# Patient Record
Sex: Male | Born: 1950 | Race: White | Hispanic: No | Marital: Married | Smoking: Never smoker
Health system: Southern US, Community
[De-identification: ages and names within clinical notes are randomized; demographics above are authoritative.]

## PROBLEM LIST (undated history)

## (undated) DIAGNOSIS — M109 Gout, unspecified: Secondary | ICD-10-CM

## (undated) DIAGNOSIS — I10 Essential (primary) hypertension: Secondary | ICD-10-CM

## (undated) DIAGNOSIS — M1990 Unspecified osteoarthritis, unspecified site: Secondary | ICD-10-CM

## (undated) DIAGNOSIS — N529 Male erectile dysfunction, unspecified: Secondary | ICD-10-CM

## (undated) DIAGNOSIS — H9193 Unspecified hearing loss, bilateral: Secondary | ICD-10-CM

## (undated) DIAGNOSIS — C61 Malignant neoplasm of prostate: Secondary | ICD-10-CM

## (undated) DIAGNOSIS — R351 Nocturia: Secondary | ICD-10-CM

## (undated) HISTORY — DX: Malignant neoplasm of prostate: C61

## (undated) HISTORY — PX: KNEE SURGERY: SHX244

## (undated) HISTORY — PX: CHOLECYSTECTOMY: SHX55

## (undated) HISTORY — DX: Unspecified hearing loss, bilateral: H91.93

## (undated) HISTORY — PX: OTHER SURGICAL HISTORY: SHX169

## (undated) HISTORY — PX: TONSILECTOMY, ADENOIDECTOMY, BILATERAL MYRINGOTOMY AND TUBES: SHX2538

## (undated) HISTORY — DX: Unspecified osteoarthritis, unspecified site: M19.90

## (undated) HISTORY — DX: Nocturia: R35.1

## (undated) HISTORY — DX: Essential (primary) hypertension: I10

## (undated) HISTORY — DX: Gout, unspecified: M10.9

## (undated) HISTORY — DX: Male erectile dysfunction, unspecified: N52.9

---

## 2020-02-06 ENCOUNTER — Ambulatory Visit: Payer: Self-pay | Admitting: Urology

## 2020-06-17 ENCOUNTER — Other Ambulatory Visit: Payer: Self-pay

## 2020-06-17 ENCOUNTER — Ambulatory Visit (INDEPENDENT_AMBULATORY_CARE_PROVIDER_SITE_OTHER): Payer: Medicare Other | Admitting: Podiatry

## 2020-06-17 ENCOUNTER — Ambulatory Visit (INDEPENDENT_AMBULATORY_CARE_PROVIDER_SITE_OTHER): Payer: Medicare Other

## 2020-06-17 DIAGNOSIS — M109 Gout, unspecified: Secondary | ICD-10-CM | POA: Diagnosis not present

## 2020-06-17 DIAGNOSIS — M7752 Other enthesopathy of left foot: Secondary | ICD-10-CM | POA: Diagnosis not present

## 2020-06-17 DIAGNOSIS — B351 Tinea unguium: Secondary | ICD-10-CM | POA: Diagnosis not present

## 2020-06-17 NOTE — Progress Notes (Signed)
   HPI: 70 y.o. male presenting today as a new patient for evaluation of 2 separate complaints.  First, the patient states that he has toenail fungus to the right hallux nail plate.  He has tried very expensive prescription Jublia topical with no improvement of the nail.  He states that it was a lot of money and there was no improvement of the nail despite daily applications. Next, the patient states that most recently he had an acute gout attack to his left great toe joint.  He states that he is always had a bump on the toe however when he changed some of his blood pressure medications it caused an acute gout flareup.  Currently the pain is 1/10.  He states over the past few weeks the pain has significantly improved.  No past medical history on file.   Physical Exam: General: The patient is alert and oriented x3 in no acute distress.  Dermatology: Skin is warm, dry and supple bilateral lower extremities. Negative for open lesions or macerations.  Hyperkeratotic dystrophic discolored nail noted to the right hallux nail plate consistent with findings of onychomycosis  Vascular: Palpable pedal pulses bilaterally.  There is some very mild edema and erythema localized around the first MTPJ of the left foot. Capillary refill within normal limits.  Neurological: Epicritic and protective threshold grossly intact bilaterally.   Musculoskeletal Exam: Range of motion within normal limits to all pedal and ankle joints bilateral. Muscle strength 5/5 in all groups bilateral.  There is some limited range of motion with degenerative changes noted to the first MTPJ of the left foot  Radiographic Exam:  Normal osseous mineralization.  Joint space narrowing with periarticular spurring noted to the first MTPJ left foot.  Assessment: 1.  First MTPJ capsulitis/hallux limitus left 2.  Acute gout first MTPJ left; mostly improved 3.  Onychomycosis of toenail right hallux nail plate   Plan of Care:  1. Patient  evaluated. X-Rays reviewed.  2.  Patient states that the right hallux nail plate does not bother him whatsoever.  Recommend simply observing the nail for now and keeping it trimmed and shortened.  The patient agrees 3.  In regards to the acute gout, the patient states that it has significantly improved over the last few weeks.  Continue conservative treatment.  Patient declined any steroidal injection or oral anti-inflammatory. 4.  Return to clinic as needed     Edrick Kins, DPM [facility]  Dr. Edrick Kins, DPM    [address], [place] [address]                Office [phone]  Fax [phone]

## 2020-07-24 DIAGNOSIS — Z8546 Personal history of malignant neoplasm of prostate: Secondary | ICD-10-CM | POA: Insufficient documentation

## 2020-07-24 DIAGNOSIS — M1A00X0 Idiopathic chronic gout, unspecified site, without tophus (tophi): Secondary | ICD-10-CM | POA: Diagnosis present

## 2020-07-24 DIAGNOSIS — I10 Essential (primary) hypertension: Secondary | ICD-10-CM | POA: Diagnosis present

## 2020-08-05 ENCOUNTER — Other Ambulatory Visit: Payer: Self-pay

## 2020-08-05 ENCOUNTER — Encounter: Payer: Self-pay | Admitting: Urology

## 2020-08-05 ENCOUNTER — Ambulatory Visit (INDEPENDENT_AMBULATORY_CARE_PROVIDER_SITE_OTHER): Payer: Medicare Other | Admitting: Urology

## 2020-08-05 VITALS — BP 189/91 | HR 77 | Ht 72.0 in | Wt 250.0 lb

## 2020-08-05 DIAGNOSIS — Z8546 Personal history of malignant neoplasm of prostate: Secondary | ICD-10-CM | POA: Diagnosis not present

## 2020-08-05 DIAGNOSIS — N400 Enlarged prostate without lower urinary tract symptoms: Secondary | ICD-10-CM | POA: Diagnosis not present

## 2020-08-05 DIAGNOSIS — N5203 Combined arterial insufficiency and corporo-venous occlusive erectile dysfunction: Secondary | ICD-10-CM

## 2020-08-05 MED ORDER — SILODOSIN 8 MG PO CAPS: 8.0000 mg | ORAL_CAPSULE | Freq: Every day | ORAL | 4 refills | Status: DC

## 2020-08-05 NOTE — Progress Notes (Signed)
08/05/2020 10:42 AM   Steve Sampson 21-Apr-1951 [id number]  Referring provider: [name], MD [facility] [place],  [place] [address]  Chief Complaint  Patient presents with  . Prostate Cancer    HPI: 70 year old male with a personal history of prostate cancer, erectile dysfunction nocturia presents today to establish care.  He was initially diagnosed with prostate cancer at age 79.  He underwent brachytherapy seed implantation.  This was in [place] at [facility] by Dr. Jilda Roche.  We do not have the records of the specifics in terms of risk category, etc.  His PSA has been low and stable, later appears to be around 0.1.  PSA was obtained by Dr. Doy Hutching on 07/24/2020 was 0.04.  He also has a personal history of BPH and was is chronically on Rapaflo.  With this medication, he has no urinary symptoms.  He denies any weak stream, incomplete bladder emptying, or any other symptoms on this drug.  He has been on it for many years.  He is requesting a refill.  He also has a personal issue of erectile dysfunction.  He previously used Cialis but is no longer using this medication.  He does not find it particularly effective.  He was also tried on sildenafil but this makes him dizzy.  At one while living in [place], they were considering doing a deep duplex penile Doppler but he never followed through with this.   PMH: Past Medical History:  Diagnosis Date  . Arthritis   . Bilateral hearing loss   . ED (erectile dysfunction)   . Gout   . Hypertension   . Nocturia   . Prostate cancer [facility])     Surgical History: Past Surgical History:  Procedure Laterality Date  . CHOLECYSTECTOMY    . KNEE SURGERY Right   . seed implantation prostate    . TONSILECTOMY, ADENOIDECTOMY, BILATERAL MYRINGOTOMY AND TUBES      Home Medications:  Allergies as of 08/05/2020   Not on File     Medication List       Accurate as of August 05, 2020 10:42 AM. If you have any  questions, ask your nurse or doctor.        febuxostat 40 MG tablet Commonly known as: ULORIC febuxostat 40 mg tablet  TAKE 1 TABLET (40 MG TOTAL) BY MOUTH ONCE DAILY FOR 30 DAYS   hydrochlorothiazide 25 MG tablet Commonly known as: HYDRODIURIL hydrochlorothiazide 25 mg tablet  TAKE 1 TABLET BY MOUTH EVERY DAY   Ibuprofen 200 MG Caps ibuprofen 200 mg capsule   indomethacin 50 MG capsule Commonly known as: INDOCIN Take 50 mg by mouth 3 (three) times daily as needed.   lisinopril 30 MG tablet Commonly known as: ZESTRIL lisinopril 30 mg tablet  TAKE 1 TABLET BY MOUTH EVERY DAY   olmesartan 20 MG tablet Commonly known as: BENICAR Take 20 mg by mouth daily.   silodosin 8 MG Caps capsule Commonly known as: RAPAFLO Take by mouth.       Allergies: Not on File  Family History: Family History  Problem Relation Age of Onset  . Prostate cancer Paternal Grandfather   . Colon cancer Other   . Esophageal cancer Other     Social History:  has no history on file for tobacco use, alcohol use, and drug use.   Physical Exam: BP (!) 189/91   Pulse 77   Ht 6' (1.829 m)   Wt 250 lb (113.4 kg)  BMI 33.91 kg/m   Constitutional:  Alert and oriented, No acute distress. HEENT: [place] AT, moist mucus membranes.  Trachea midline, no masses. Cardiovascular: No clubbing, cyanosis, or edema. Respiratory: Normal respiratory effort, no increased work of breathing. Skin: No rashes, bruises or suspicious lesions. Neurologic: Grossly intact, no focal deficits, moving all 4 extremities. Psychiatric: Normal mood and affect.  Assessment & Plan:    1. History of prostate cancer Status post brachytherapy with no evidence of disease, PSA remains low and stable  We did sign a release for records from [facility] to see if we get his original pathology for documentation purposes  2. Benign prostatic hyperplasia without lower urinary tract symptoms Symptoms well controlled on Rapaflo, continue  this medication, refills sent x1 year  3. Combined arterial insufficiency and corporo-venous occlusive erectile dysfunction Lengthy discussion today about alternative options beyond PDE 5 inhibitors including IPP, malleable penile prosthesis, injections, VED amongst others.  He is not interested in pursuing any of these at this point in time.  F/u 1 year for IPSS/ PVR/ PSA  Steve Espy, MD  [facility] [address] [place], [place] [address] [phone]

## 2021-01-06 ENCOUNTER — Ambulatory Visit (INDEPENDENT_AMBULATORY_CARE_PROVIDER_SITE_OTHER): Payer: Medicare Other | Admitting: Podiatry

## 2021-01-06 ENCOUNTER — Other Ambulatory Visit: Payer: Self-pay

## 2021-01-06 DIAGNOSIS — B351 Tinea unguium: Secondary | ICD-10-CM

## 2021-01-06 NOTE — Progress Notes (Signed)
   HPI: 70 y.o. male presenting today for follow-up evaluation regarding a dystrophic thickened toenail to the right hallux nail plate.  Patient states that recently he has been having some pain and tenderness associated to the right hallux nail plate.  He gets routine pedicures however the last 1 caused some tenderness to the area.  He was concerned for ingrown toenail.  He says that his wife actually trim the nail and he did have some relief.  He presents to have it evaluated.  Past Medical History:  Diagnosis Date   Arthritis    Bilateral hearing loss    ED (erectile dysfunction)    Gout    Hypertension    Nocturia    Prostate cancer [facility])      Physical Exam: General: The patient is alert and oriented x3 in no acute distress.  Dermatology: Skin is warm, dry and supple bilateral lower extremities. Negative for open lesions or macerations.  Hyperkeratotic dystrophic discolored nail noted to the right hallux nail plate consistent with findings of onychomycosis  Vascular: Palpable pedal pulses bilaterally.  There is some very mild edema and erythema localized around the first MTPJ of the left foot. Capillary refill within normal limits.  Neurological: Epicritic and protective threshold grossly intact bilaterally.   Musculoskeletal Exam: No pedal deformity   Assessment: 1.  Onychomycosis of toenail right hallux nail plate   Plan of Care:  1. Patient evaluated.  2.  Mechanical debridement of the right hallux nail plate was performed using a nail nipper without incident or bleeding.  The offending nail borders of the nail were debrided away and the patient felt relief. 3.  Return to clinic as needed   Edrick Kins, DPM [facility]  Dr. Edrick Kins, DPM    [address], [place] [address]                Office [phone]  Fax [phone]

## 2021-02-25 ENCOUNTER — Ambulatory Visit (INDEPENDENT_AMBULATORY_CARE_PROVIDER_SITE_OTHER): Payer: Medicare Other | Admitting: Dermatology

## 2021-02-25 ENCOUNTER — Other Ambulatory Visit: Payer: Self-pay

## 2021-02-25 DIAGNOSIS — Z1283 Encounter for screening for malignant neoplasm of skin: Secondary | ICD-10-CM

## 2021-02-25 DIAGNOSIS — L820 Inflamed seborrheic keratosis: Secondary | ICD-10-CM

## 2021-02-25 DIAGNOSIS — D229 Melanocytic nevi, unspecified: Secondary | ICD-10-CM

## 2021-02-25 DIAGNOSIS — Z85828 Personal history of other malignant neoplasm of skin: Secondary | ICD-10-CM

## 2021-02-25 DIAGNOSIS — L578 Other skin changes due to chronic exposure to nonionizing radiation: Secondary | ICD-10-CM

## 2021-02-25 DIAGNOSIS — L219 Seborrheic dermatitis, unspecified: Secondary | ICD-10-CM

## 2021-02-25 DIAGNOSIS — L821 Other seborrheic keratosis: Secondary | ICD-10-CM

## 2021-02-25 DIAGNOSIS — D1800 Hemangioma unspecified site: Secondary | ICD-10-CM

## 2021-02-25 DIAGNOSIS — B353 Tinea pedis: Secondary | ICD-10-CM | POA: Diagnosis not present

## 2021-02-25 DIAGNOSIS — L918 Other hypertrophic disorders of the skin: Secondary | ICD-10-CM

## 2021-02-25 DIAGNOSIS — L814 Other melanin hyperpigmentation: Secondary | ICD-10-CM

## 2021-02-25 MED ORDER — KETOCONAZOLE 2 % EX CREA: TOPICAL_CREAM | CUTANEOUS | 3 refills | Status: DC

## 2021-02-25 MED ORDER — KETOCONAZOLE 2 % EX SHAM: MEDICATED_SHAMPOO | CUTANEOUS | 3 refills | Status: DC

## 2021-02-25 NOTE — Progress Notes (Signed)
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 Michel Bickers in [place] or [place] ). The patient presents for Total-Body Skin Exam (TBSE) for skin cancer screening and mole check.  The following portions of the chart were reviewed this encounter and updated as appropriate:   Allergies  Meds  Problems  Med Hx  Surg Hx  Fam Hx      Review of Systems:  No other skin or systemic complaints except as noted in HPI or Assessment and Plan.  Objective  Well appearing patient in no apparent distress; mood and affect are within normal limits.  A full examination was performed including scalp, head, eyes, ears, nose, lips, neck, chest, axillae, abdomen, back, buttocks, bilateral upper extremities, bilateral lower extremities, hands, feet, fingers, toes, fingernails, and toenails. All findings within normal limits unless otherwise noted below.  L lower eyelid, R inf scapula, L low back paraspinal (2) Erythematous keratotic or waxy stuck-on papule or plaque.    B/L foot Scaling and maceration web spaces and over distal and lateral soles.    Assessment & Plan  Inflamed seborrheic keratosis L lower eyelid; R inf scapula, L low back paraspinal (2)  Recheck at follow up appointment.  Destruction of lesion - R inf scapula, L low back paraspinal Complexity: simple   Destruction method: cryotherapy   Informed consent: discussed and consent obtained   Timeout:  patient name, date of birth, surgical site, and procedure verified Lesion destroyed using liquid nitrogen: Yes   Region frozen until ice ball extended beyond lesion: Yes   Outcome: patient tolerated procedure well with no complications   Post-procedure details: wound care instructions given    Seborrheic dermatitis Face and  scalp Seborrheic Dermatitis  -  is a chronic persistent rash characterized by pinkness and scaling most commonly of the mid face but also can occur on the scalp (dandruff), ears; mid chest, mid back and groin.  It tends to be exacerbated by stress and cooler weather.  People who have neurologic disease may experience new onset or exacerbation of existing seborrheic dermatitis.  The condition is not curable but treatable and can be controlled.  Start Ketoconazole 2% shampoo 3d/wk. Continue Ketoconazole 2% cream QHS. Consider adding HC cream if not improved at follow up appointment  ketoconazole (NIZORAL) 2 % shampoo - Face and scalp Shampoo from the neck up let sit a few minutes then wash off. Use 3d/wk. ketoconazole (NIZORAL) 2 % cream - Face and scalp Apply to the face and feet QHS.  Tinea pedis of both feet B/L foot Chronic and persistent. Use ketoconazole cream nightly to feet for at least 6 to 8 weeks.  Skin cancer screening  Lentigines - Scattered tan macules - Due to sun exposure - Benign-appearing, observe - Recommend daily broad spectrum sunscreen SPF 30+ to sun-exposed areas, reapply every 2 hours as needed. - Call for any changes  Seborrheic Keratoses - Stuck-on, waxy, tan-brown papules and/or plaques  - Benign-appearing - Discussed benign etiology and prognosis. - Observe - Call for any changes  Melanocytic Nevi - Tan-brown and/or pink-flesh-colored symmetric macules and papules - Benign appearing on exam today - Observation - Call clinic for new or changing moles - Recommend daily use of broad spectrum spf 30+ sunscreen to sun-exposed areas.  Hemangiomas - Red papules - Discussed benign nature - Observe - Call for any changes  Actinic Damage - Chronic condition, secondary to cumulative UV/sun exposure - diffuse scaly erythematous macules with underlying dyspigmentation - Recommend daily broad spectrum sunscreen SPF 30+ to sun-exposed areas, reapply every  2 hours as needed.  - Staying in the shade or wearing long sleeves, sun glasses (UVA+UVB protection) and wide brim hats (4-inch brim around the entire circumference of the hat) are also recommended for sun protection.  - Call for new or changing lesions.  Acrochordons (Skin Tags) - Fleshy, skin-colored pedunculated papules - Benign appearing.  - Observe. - If desired, they can be removed with an in office procedure that is not covered by insurance. - Please call the clinic if you notice any new or changing lesions.  Skin cancer screening performed today.  Return in about 1 year (around 02/25/2022) for TBSE.  [name], CMA, am acting as scribe for Sarina Ser, MD . Documentation: I have reviewed the above documentation for accuracy and completeness, and I agree with the above.  Sarina Ser, MD

## 2021-02-25 NOTE — Patient Instructions (Signed)

## 2021-03-03 ENCOUNTER — Encounter: Payer: Self-pay | Admitting: Dermatology

## 2021-03-17 ENCOUNTER — Ambulatory Visit: Payer: Medicare Other | Admitting: Dermatology

## 2021-03-19 ENCOUNTER — Ambulatory Visit: Payer: Medicare Other | Admitting: Urology

## 2021-03-25 ENCOUNTER — Encounter: Payer: Self-pay | Admitting: Urology

## 2021-03-25 ENCOUNTER — Other Ambulatory Visit: Payer: Self-pay

## 2021-03-25 ENCOUNTER — Ambulatory Visit (INDEPENDENT_AMBULATORY_CARE_PROVIDER_SITE_OTHER): Payer: Medicare Other | Admitting: Urology

## 2021-03-25 ENCOUNTER — Ambulatory Visit: Payer: Medicare Other | Admitting: Urology

## 2021-03-25 VITALS — BP 180/109 | HR 80 | Ht 72.0 in | Wt 252.0 lb

## 2021-03-25 DIAGNOSIS — N529 Male erectile dysfunction, unspecified: Secondary | ICD-10-CM

## 2021-03-25 DIAGNOSIS — Z8546 Personal history of malignant neoplasm of prostate: Secondary | ICD-10-CM | POA: Diagnosis not present

## 2021-03-25 DIAGNOSIS — R3121 Asymptomatic microscopic hematuria: Secondary | ICD-10-CM

## 2021-03-25 LAB — URINALYSIS, COMPLETE
Bilirubin, UA: NEGATIVE
Glucose, UA: NEGATIVE
Ketones, UA: NEGATIVE
Leukocytes,UA: NEGATIVE
Nitrite, UA: NEGATIVE
Protein,UA: NEGATIVE
RBC, UA: NEGATIVE
Specific Gravity, UA: 1.025 (ref 1.005–1.030)
Urobilinogen, Ur: 0.2 mg/dL (ref 0.2–1.0)
pH, UA: 5.5 (ref 5.0–7.5)

## 2021-03-25 LAB — MICROSCOPIC EXAMINATION
Bacteria, UA: NONE SEEN
Epithelial Cells (non renal): NONE SEEN /hpf (ref 0–10)
WBC, UA: NONE SEEN /hpf (ref 0–5)

## 2021-03-25 NOTE — Progress Notes (Signed)
   03/25/2021 1:56 PM   Steve Sampson [birth year] [id number]  Reason for visit: Follow up prostate cancer, ED, microscopic hematuria  HPI: 70 year old male who was diagnosed with a single core of low risk 3+3= 6 prostate cancer at age 82 and treated with brachytherapy at [facility] in [place].  PSA has been very low since that time, most recently 0.02 which is stable from 0.04 last year.  Aside from nocturia 1-2 times per night when he consumes soda or caffeine he really has no urinary symptoms.  He denies any gross hematuria.  He was recently found to have 10-50 RBCs on a urinalysis, but he reports this was after mowing the lawn and having some pelvic discomfort.  Urinalysis today is completely benign with 0 RBCs.  He reportedly had negative work-up previously for microscopic hematuria in the [facility] with cystoscopy and CT.  He also reportedly had a normal CT within the last 12 months at an outside hospital.  We discussed AUA guidelines that recommend considering cystoscopy and CT urogram for patients with microscopic hematuria, and he would like to defer further evaluation at this time, but he understands the need for further analysis if he has new gross hematuria, or significant microscopic hematuria on repeat urinalysis in the future.  He understands the low, but nonzero risk of missing a clinically significant malignancy by deferring further evaluation.  He does not have any smoking history, but previously worked with numerous occupational hazards.  Finally, he has had ED refractory to Viagra(severe dizziness) and Cialis(stopped being effective).  He is not interested in injections or penile prosthesis, but is interested in some of the new ultrasound ED technologies.  We reviewed the AUA guidelines that do not show good evidence behind these newer technologies, and that that is not offered here in [place].  Recommend repeat urinalysis and PSA in 1 year   I spent 30 total minutes on the day of the  encounter including pre-visit review of the medical record, face-to-face time with the patient, and post visit ordering of labs/imaging/tests.  Billey Co, [facility] [address], [place], [place] [address] [phone]

## 2021-06-01 ENCOUNTER — Ambulatory Visit: Payer: Medicare Other | Admitting: Dermatology

## 2021-06-16 ENCOUNTER — Observation Stay
Admission: EM | Admit: 2021-06-16 | Discharge: 2021-06-17 | Disposition: A | Discharge status: Home / Self Care | Payer: Medicare PPO | Attending: Internal Medicine | Admitting: Internal Medicine

## 2021-06-16 ENCOUNTER — Encounter: Payer: Self-pay | Admitting: Internal Medicine

## 2021-06-16 ENCOUNTER — Emergency Department: Payer: Medicare PPO

## 2021-06-16 ENCOUNTER — Other Ambulatory Visit: Payer: Self-pay

## 2021-06-16 ENCOUNTER — Inpatient Hospital Stay: Payer: Medicare PPO

## 2021-06-16 DIAGNOSIS — Z79899 Other long term (current) drug therapy: Secondary | ICD-10-CM | POA: Insufficient documentation

## 2021-06-16 DIAGNOSIS — I6782 Cerebral ischemia: Secondary | ICD-10-CM | POA: Insufficient documentation

## 2021-06-16 DIAGNOSIS — Z8546 Personal history of malignant neoplasm of prostate: Secondary | ICD-10-CM | POA: Insufficient documentation

## 2021-06-16 DIAGNOSIS — R4781 Slurred speech: Principal | ICD-10-CM | POA: Diagnosis present

## 2021-06-16 DIAGNOSIS — H539 Unspecified visual disturbance: Secondary | ICD-10-CM

## 2021-06-16 DIAGNOSIS — I10 Essential (primary) hypertension: Secondary | ICD-10-CM | POA: Diagnosis not present

## 2021-06-16 DIAGNOSIS — E1165 Type 2 diabetes mellitus with hyperglycemia: Secondary | ICD-10-CM | POA: Diagnosis not present

## 2021-06-16 DIAGNOSIS — H538 Other visual disturbances: Secondary | ICD-10-CM | POA: Diagnosis not present

## 2021-06-16 DIAGNOSIS — R531 Weakness: Secondary | ICD-10-CM | POA: Diagnosis not present

## 2021-06-16 DIAGNOSIS — I4510 Unspecified right bundle-branch block: Secondary | ICD-10-CM | POA: Insufficient documentation

## 2021-06-16 DIAGNOSIS — Z8616 Personal history of COVID-19: Secondary | ICD-10-CM | POA: Insufficient documentation

## 2021-06-16 DIAGNOSIS — R600 Localized edema: Secondary | ICD-10-CM | POA: Insufficient documentation

## 2021-06-16 DIAGNOSIS — Z20822 Contact with and (suspected) exposure to covid-19: Secondary | ICD-10-CM | POA: Insufficient documentation

## 2021-06-16 DIAGNOSIS — M1A00X0 Idiopathic chronic gout, unspecified site, without tophus (tophi): Secondary | ICD-10-CM | POA: Diagnosis present

## 2021-06-16 DIAGNOSIS — R4789 Other speech disturbances: Secondary | ICD-10-CM

## 2021-06-16 DIAGNOSIS — M1A9XX0 Chronic gout, unspecified, without tophus (tophi): Secondary | ICD-10-CM | POA: Diagnosis present

## 2021-06-16 DIAGNOSIS — I159 Secondary hypertension, unspecified: Secondary | ICD-10-CM

## 2021-06-16 LAB — BASIC METABOLIC PANEL
Anion gap: 10 (ref 5–15)
BUN: 21 mg/dL (ref 8–23)
CO2: 25 mmol/L (ref 22–32)
Calcium: 9.6 mg/dL (ref 8.9–10.3)
Chloride: 104 mmol/L (ref 98–111)
Creatinine, Ser: 1.06 mg/dL (ref 0.61–1.24)
GFR, Estimated: >60 mL/min (ref >60)
Glucose, Bld: 152 mg/dL — ABNORMAL HIGH (ref 70–99)
Potassium: 3.9 mmol/L (ref 3.5–5.1)
Sodium: 139 mmol/L (ref 135–145)

## 2021-06-16 LAB — CBC
HCT: 45.1 % (ref 39.0–52.0)
Hemoglobin: 14.9 g/dL (ref 13.0–17.0)
MCH: 27.4 pg (ref 26.0–34.0)
MCHC: 33 g/dL (ref 30.0–36.0)
MCV: 83.1 fL (ref 80.0–100.0)
Platelets: 189 10*3/uL (ref 150–400)
RBC: 5.43 MIL/uL (ref 4.22–5.81)
RDW: 13.1 % (ref 11.5–15.5)
WBC: 5.7 10*3/uL (ref 4.0–10.5)
nRBC: 0 % (ref 0.0–0.2)

## 2021-06-16 LAB — RESP PANEL BY RT-PCR (FLU A&B, COVID) ARPGX2
Influenza A by PCR: NEGATIVE
Influenza B by PCR: NEGATIVE
SARS Coronavirus 2 by RT PCR: NEGATIVE

## 2021-06-16 LAB — PROTIME-INR
INR: 1 (ref 0.8–1.2)
Prothrombin Time: 12.8 seconds (ref 11.4–15.2)

## 2021-06-16 LAB — TROPONIN I (HIGH SENSITIVITY): Troponin I (High Sensitivity): 13 ng/L (ref <18)

## 2021-06-16 LAB — T4, FREE: Free T4: 0.77 ng/dL (ref 0.61–1.12)

## 2021-06-16 IMAGING — CT CT HEAD W/O CM
4 series · 16 of 47 positions shown, 18 images · non-contrast
Comparison: None.

CLINICAL DATA: Dizziness, nonspecific. Blurred vision and
hypertension. Slurred speech.



[Series 2: head wo · axial · 0.43mm/px · z∈[+551,+671]mm · 7 of 33 slices shown, 9 images]
[im 5/33  brain]
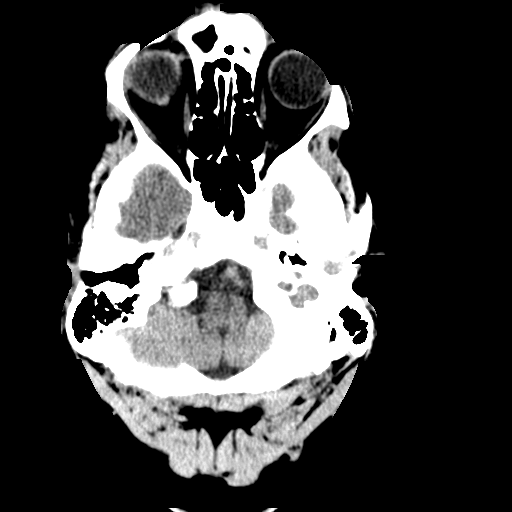
[im 5/33  bone]
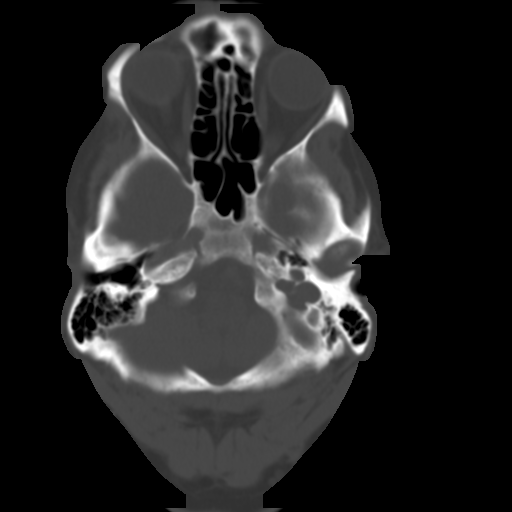
[im 9/33  brain]
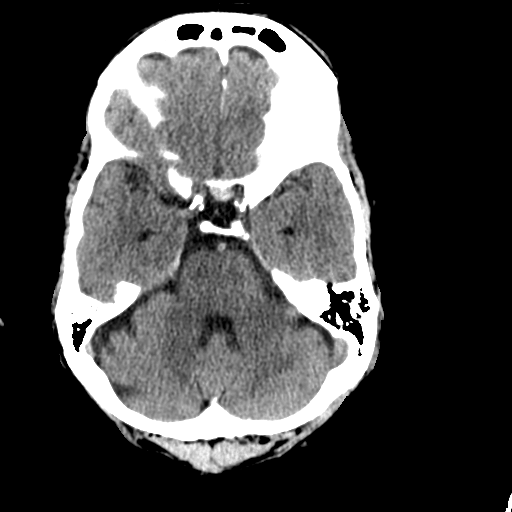
[im 13/33  brain]
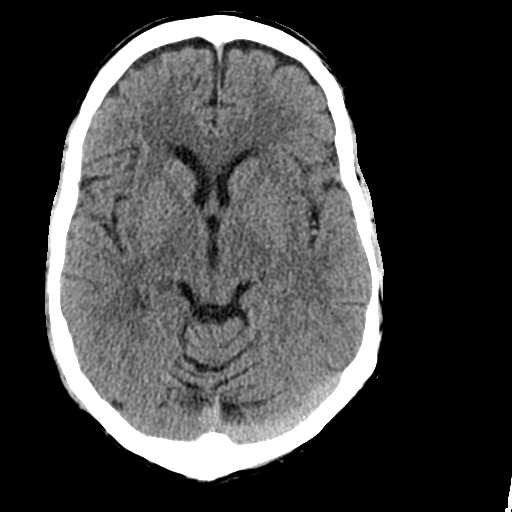
[im 17/33  brain]
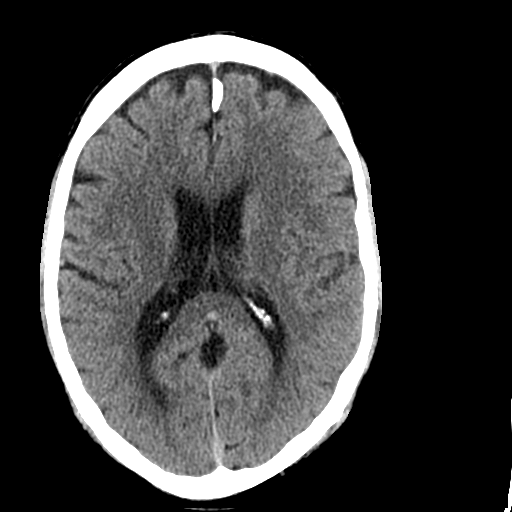
[im 21/33  brain]
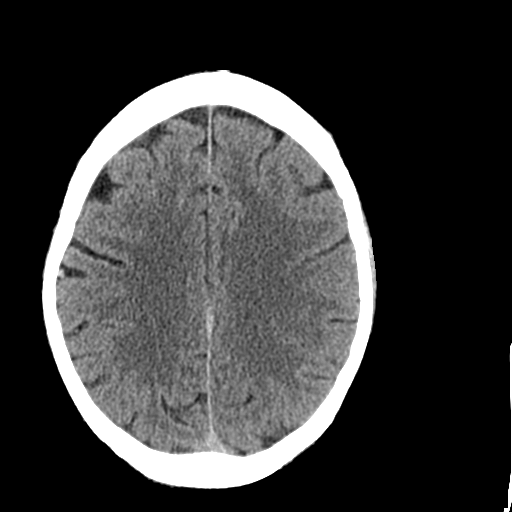
[im 21/33  bone]
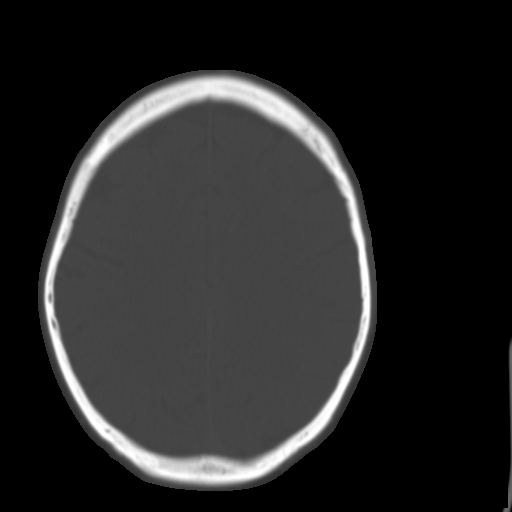
[im 25/33  brain]
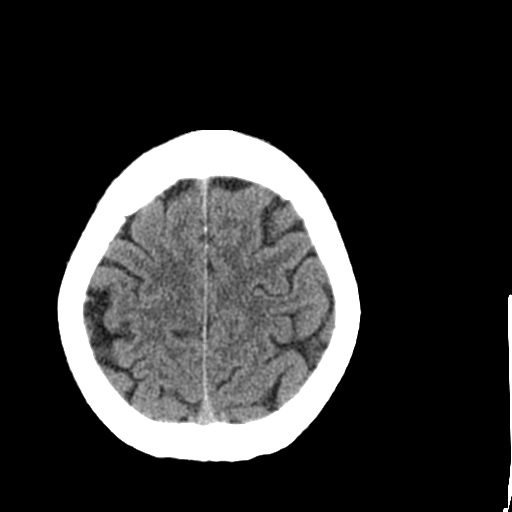
[im 29/33  brain]
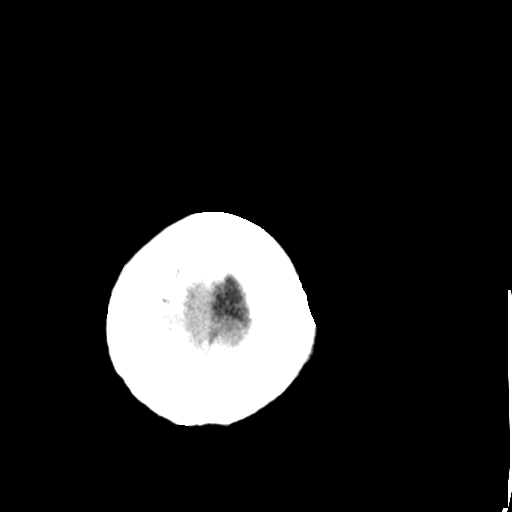

[Series 3: coronal soft tissue · coronal · 0.31mm/px · 3 of 72 slices shown]
[im 24/72  brain]
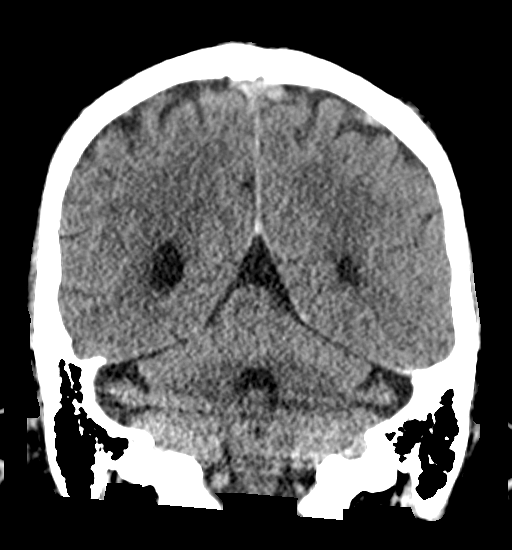
[im 32/72  brain]
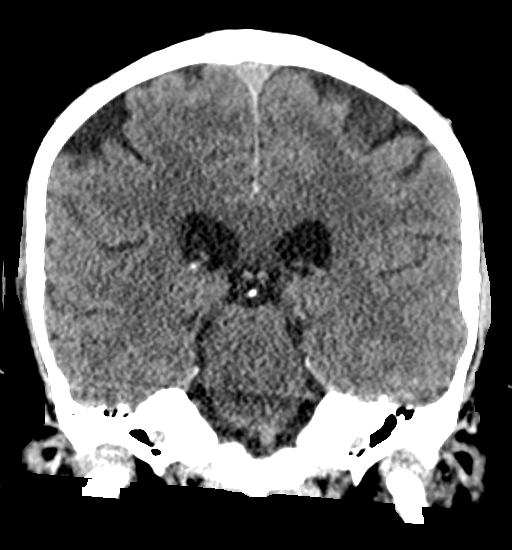
[im 40/72  brain]
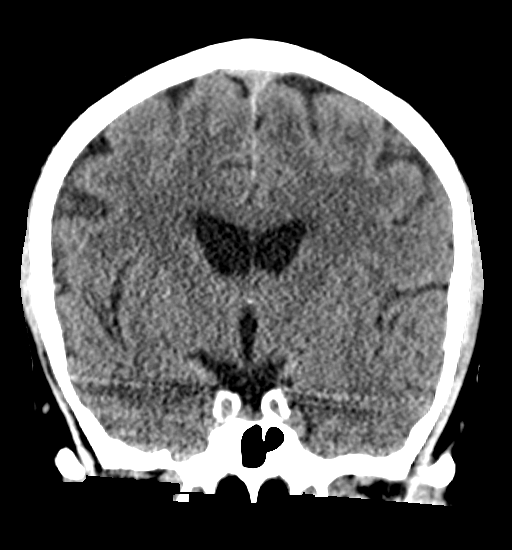

[Series 4: head bone · axial · 0.43mm/px · z∈[+547,+579]mm · 3 of 81 slices shown]
[im 9/81  bone]
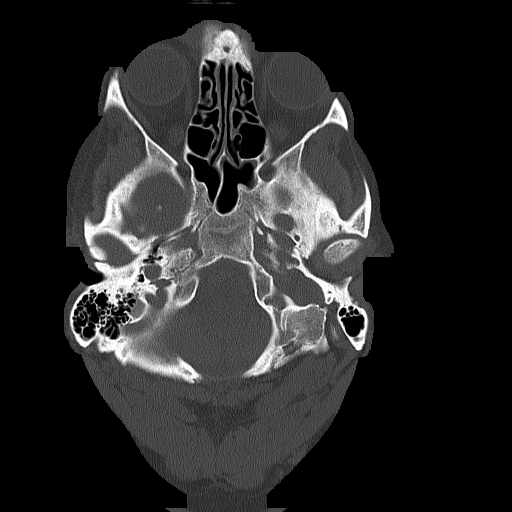
[im 17/81  bone]
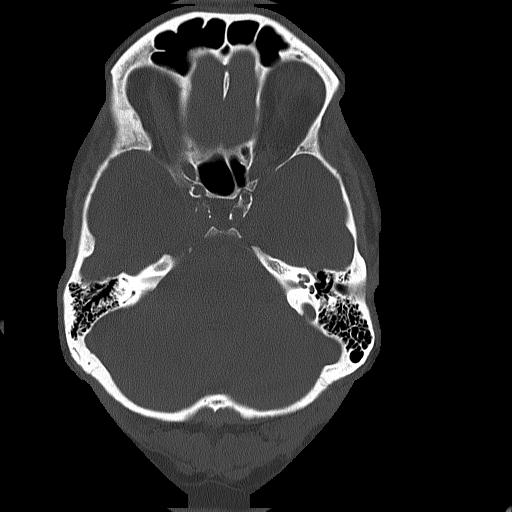
[im 25/81  bone]
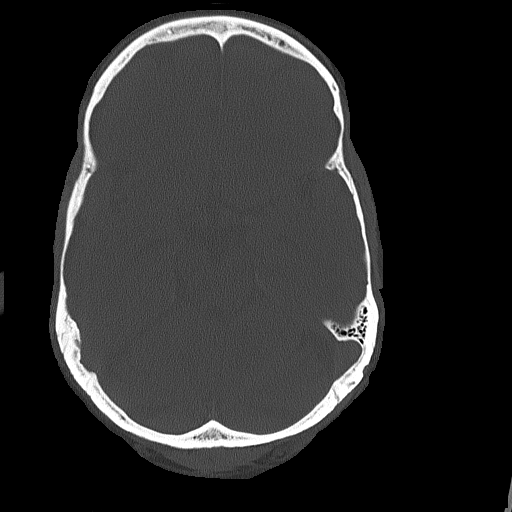

[Series 5: sagittal soft tissue · sagittal · 0.34mm/px · 3 of 54 slices shown]
[im 18/54  brain]
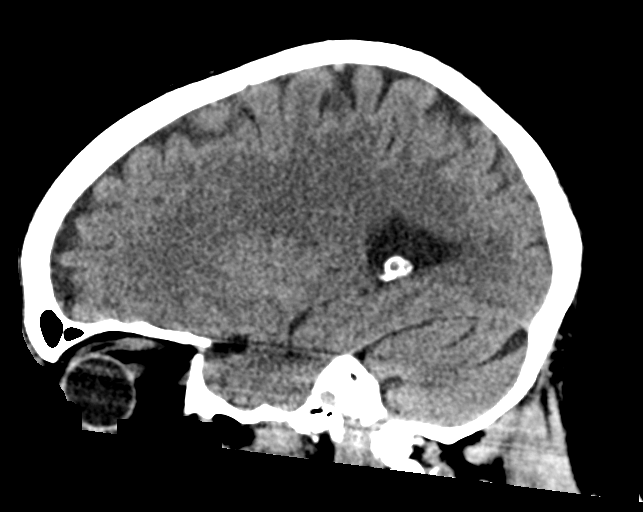
[im 27/54  brain]
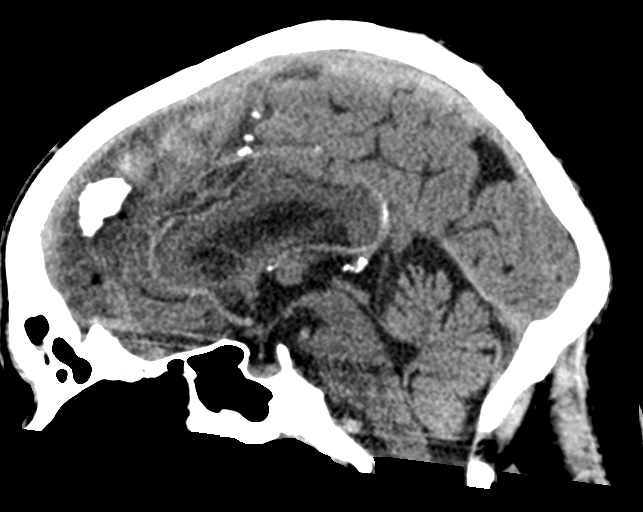
[im 36/54  brain]
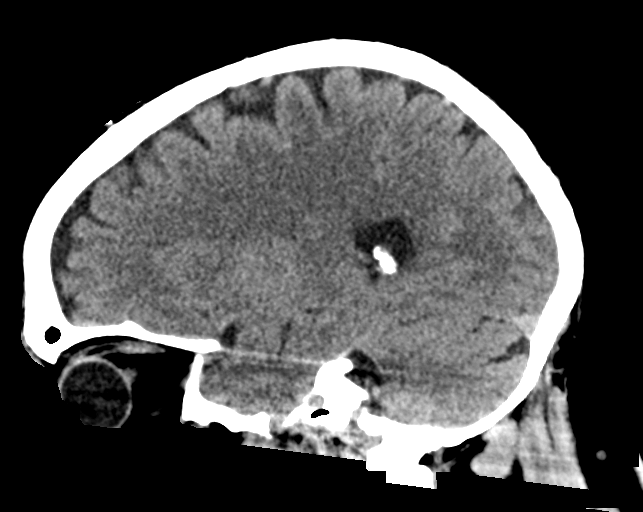

[16 of 47 positions shown; findings below may reference images not displayed]

FINDINGS: Brain: No focal abnormality seen affecting the brainstem or
cerebellum. Cerebral hemispheres show mild age related volume loss
without evidence of old or acute focal infarction, mass lesion,
hemorrhage, hydrocephalus or extra-axial collection.

Vascular: There is atherosclerotic calcification of the major
vessels at the base of the brain.

Skull: Negative

Sinuses/Orbits: Clear/normal

Other: None
IMPRESSION: No focal or acute brain finding. Mild age related volume loss.
Atherosclerotic calcification of the major vessels at the base of
the brain, usually seen at this age.

## 2021-06-16 IMAGING — MR MR MRA NECK WO/W CM
4 of 5 series · 36 of 48 positions shown · IV contrast (gadavist)
Comparison: None.

CLINICAL DATA: TIA

EXAM:
MRA NECK WITHOUT AND WITH CONTRAST
TECHNIQUE: Multiplanar and multiecho pulse sequences of the neck were obtained
without and with intravenous contrast. Angiographic images of the
neck were obtained using MRA technique without and with intravenous
contrast.
CONTRAST:  10mL GADAVIST GADOBUTROL 1 MMOL/ML IV SOLN

[Series 13: angio_fl3d_cor_pre_ttc=2.0s_moco-adv_sub · coronal · B · 0.9mm · 0.85mm/px · 9 of 96 slices shown]
[im 1/96]
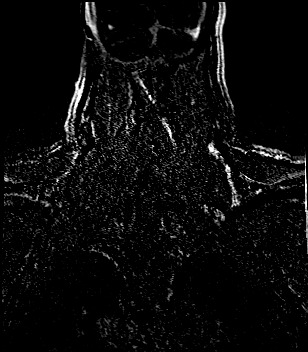
[im 12/96]
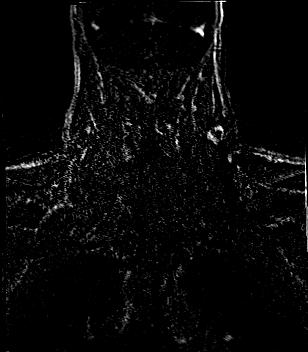
[im 24/96]
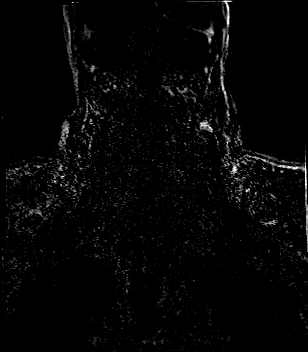
[im 36/96]
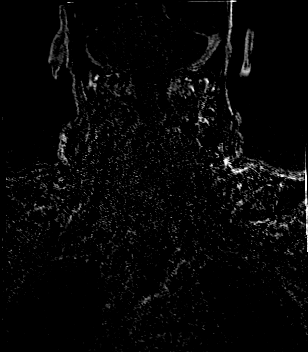
[im 48/96]
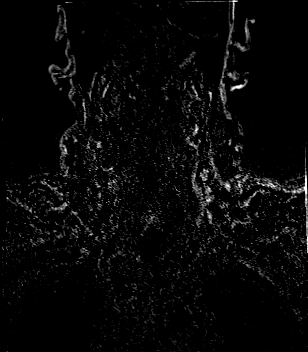
[im 60/96]
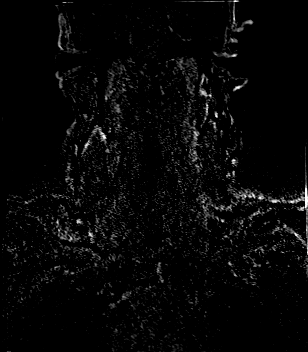
[im 72/96]
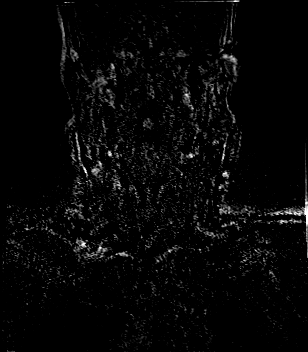
[im 84/96]
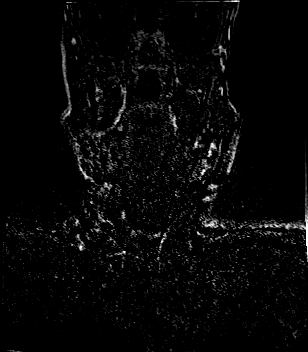
[im 96/96]
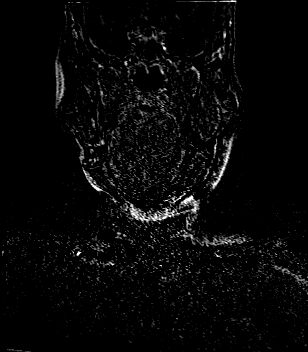

[Series 16: angio_fl3d_cor_post_ttc=2.0s · coronal · B · 0.9mm · 0.85mm/px · 9 of 96 slices shown]
[im 1/96]
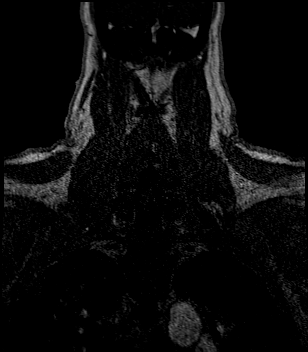
[im 12/96]
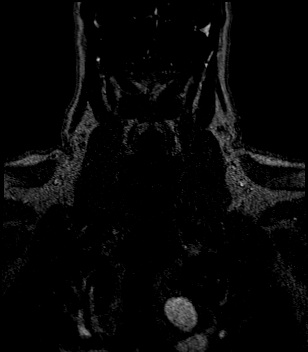
[im 24/96]
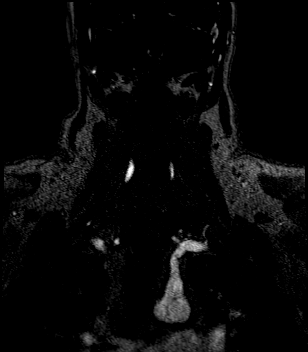
[im 36/96]
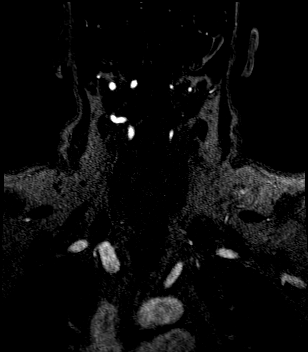
[im 48/96]
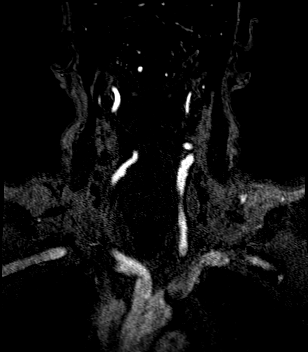
[im 60/96]
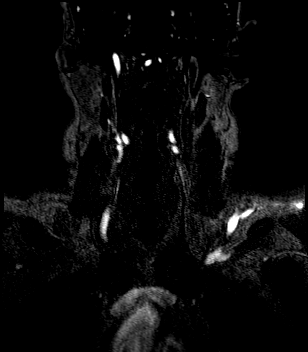
[im 72/96]
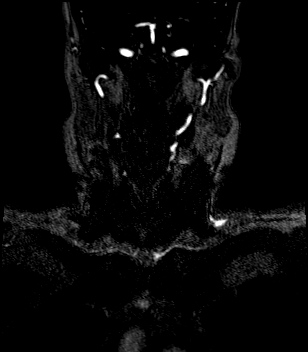
[im 84/96]
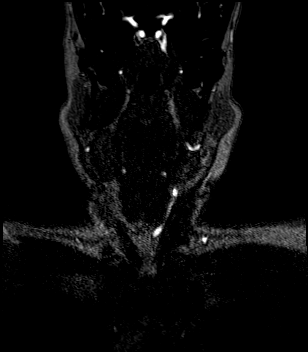
[im 96/96]
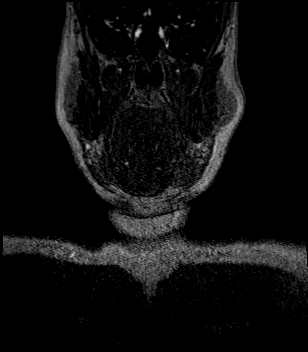

[Series 17: angio_fl3d_cor_post_ttc=2.0s_moco-adv · coronal · B · 0.9mm · 0.85mm/px · 9 of 96 slices shown]
[im 1/96]
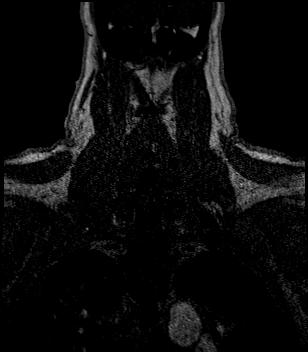
[im 12/96]
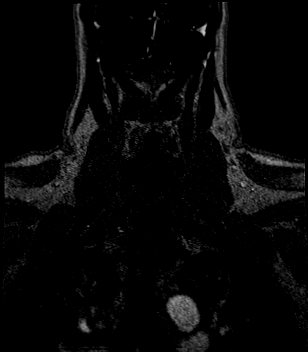
[im 24/96]
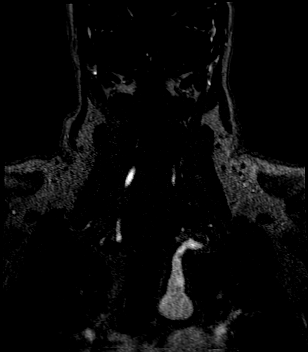
[im 36/96]
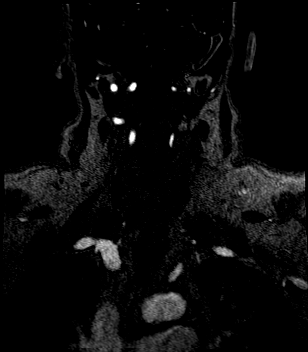
[im 48/96]
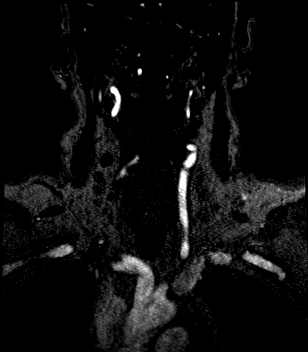
[im 60/96]
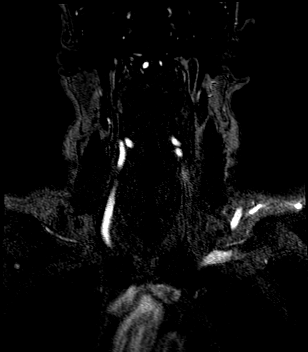
[im 72/96]
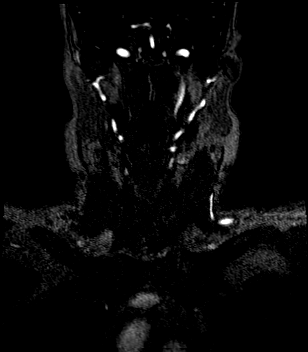
[im 84/96]
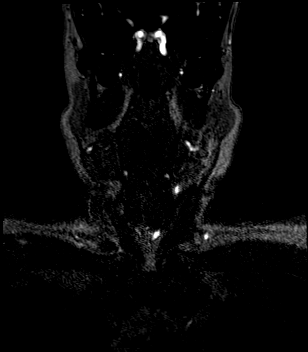
[im 96/96]
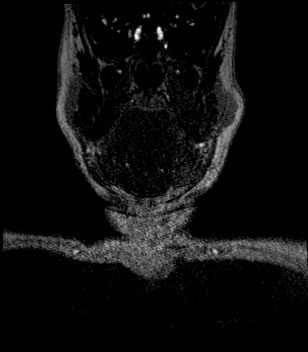

[Series 18: angio_fl3d_cor_post_ttc=2.0s_moco-adv_sub · coronal · B · 0.9mm · 0.85mm/px · 9 of 94 slices shown]
[im 1/94]
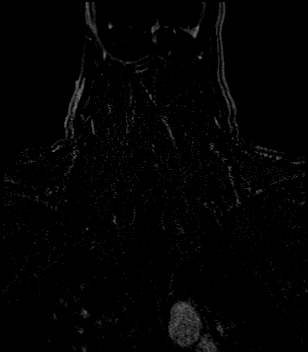
[im 12/94]
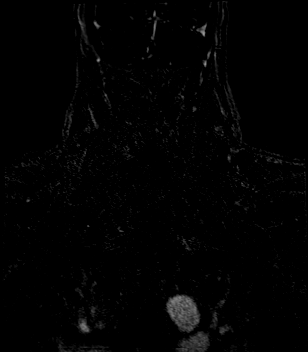
[im 24/94]
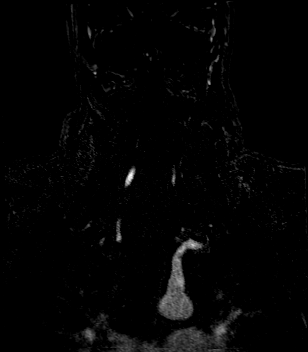
[im 35/94]
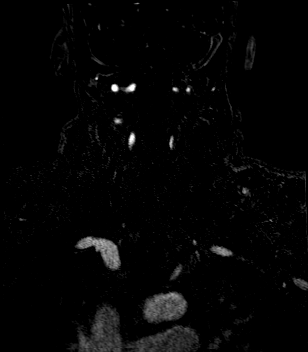
[im 47/94]
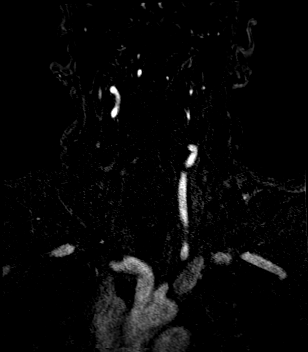
[im 59/94]
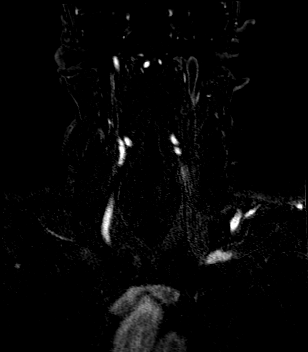
[im 70/94]
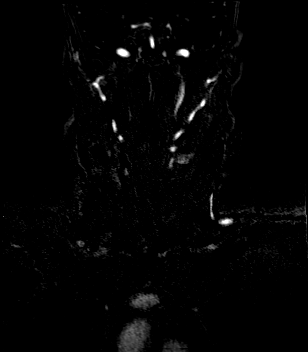
[im 82/94]
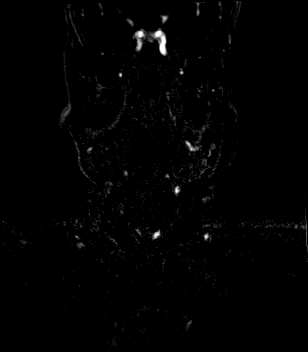
[im 94/94]
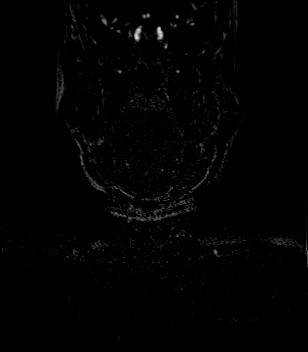

[36 of 48 positions shown; findings below may reference images not displayed]

FINDINGS: Normal aortic branching. No evidence of dissection or significant
stenosis of the origins of the branch vessels.

Common, internal, and external carotid arteries are patent, without
hemodynamically significant stenosis. Extracranial vertebral
arteries are patent, without hemodynamically significant stenosis.
The left vertebral artery is diminutive from its origin to the
terminus. Right dominant system.
IMPRESSION: No hemodynamically significant stenosis in the neck.

## 2021-06-16 IMAGING — MR MR HEAD W/O CM
11 series · 48 of 48 positions shown · non-contrast
Comparison: None.

CLINICAL DATA: Neuro deficit, acute, stroke suspected

EXAM:
MRI HEAD WITHOUT CONTRAST
TECHNIQUE: Multiplanar, multiecho pulse sequences of the brain and surrounding
structures were obtained without intravenous contrast.

[Series 5: ax dwi_tracew · axial · 3.0mm · 1.80mm/px · z∈[-82,+72]mm · 5 of 48 slices shown]
[im 1/48]
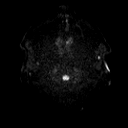
[im 12/48]
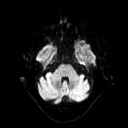
[im 24/48]
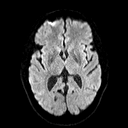
[im 36/48]
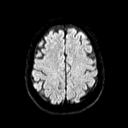
[im 48/48]
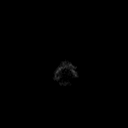

[Series 6: ax dwi_adc · axial · 3.0mm · 1.80mm/px · z∈[-82,+72]mm · 4 of 48 slices shown]
[im 1/48]
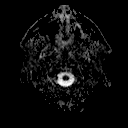
[im 16/48]
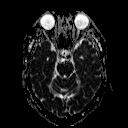
[im 32/48]
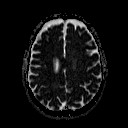
[im 48/48]
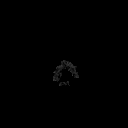

[Series 7: cor dwi_tracew · coronal · 5.0mm · 1.80mm/px · 3 of 40 slices shown]
[im 1/40]
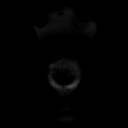
[im 20/40]
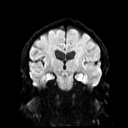
[im 40/40]
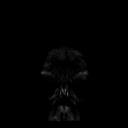

[Series 8: cor dwi_adc · coronal · 5.0mm · 1.80mm/px · 3 of 40 slices shown]
[im 1/40]
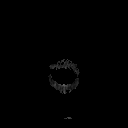
[im 20/40]
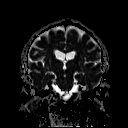
[im 40/40]
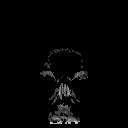

[Series 9: T1 · sagittal · 5.0mm · 0.62mm/px · 2 of 23 slices shown (1 of 2)]
[im 1/23]
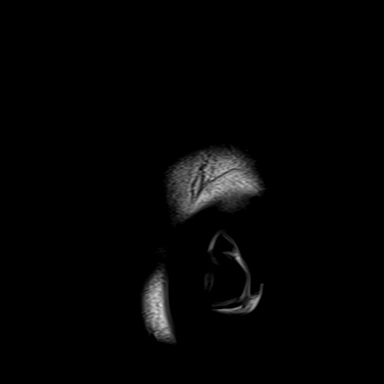
[im 23/23]
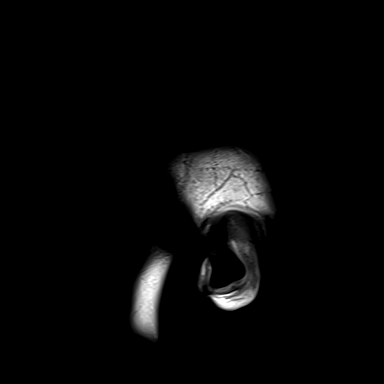

[Series 10: T2 · axial · 5.0mm · 0.86mm/px · z∈[-83,+72]mm · 2 of 27 slices shown (1 of 2)]
[im 1/27]
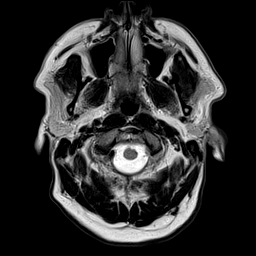
[im 27/27]
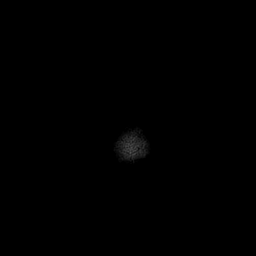

[Series 12: pha_images · axial · 3.0mm · 0.90mm/px · z∈[-82,+71]mm · 4 of 52 slices shown]
[im 1/52]
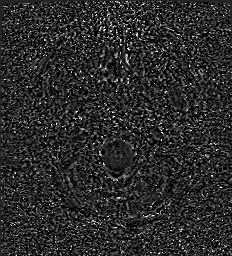
[im 18/52]
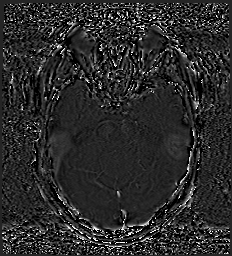
[im 35/52]
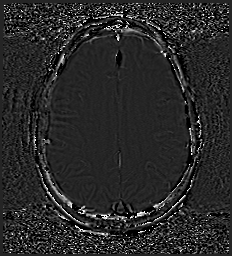
[im 52/52]
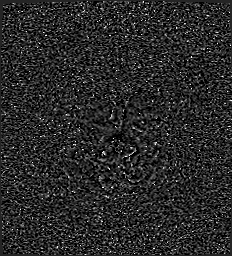

[Series 13: swi_images · axial · 3.0mm · 0.90mm/px · z∈[-82,+71]mm · 4 of 52 slices shown]
[im 1/52]
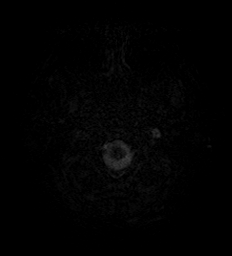
[im 18/52]
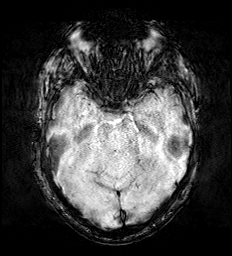
[im 35/52]
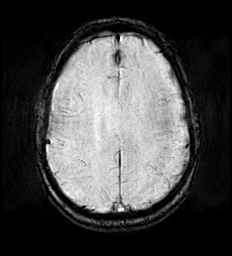
[im 52/52]
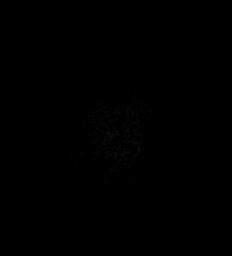

[Series 15: FLAIR · axial · 3.0mm · 0.69mm/px · z∈[-86,+75]mm · 4 of 55 slices shown]
[im 1/55]
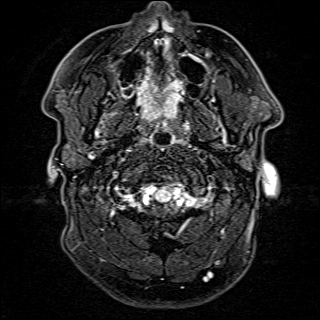
[im 19/55]
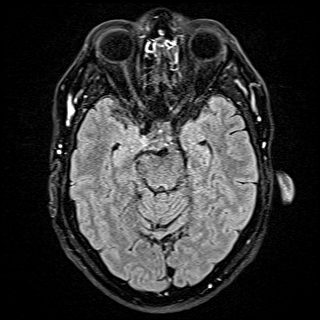
[im 37/55]
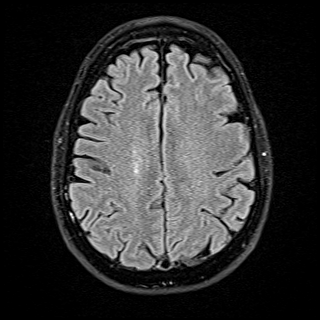
[im 55/55]
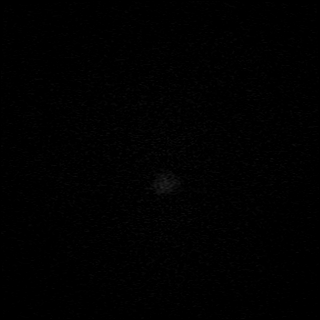

[Series 16: T1 · axial · 1.0mm · 0.98mm/px · z∈[-88,+86]mm · 14 of 175 slices shown (2 of 2)]
[im 1/175]
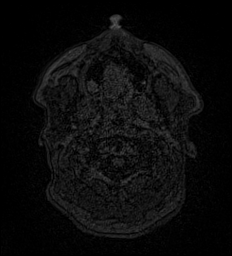
[im 14/175]
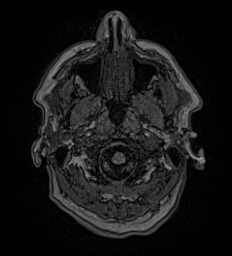
[im 27/175]
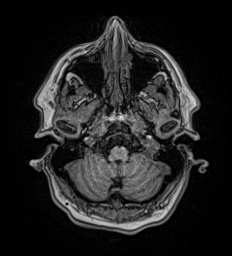
[im 41/175]
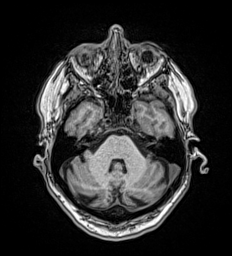
[im 54/175]
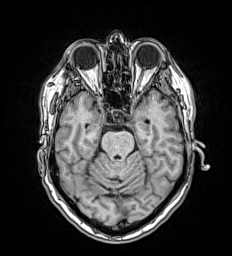
[im 67/175]
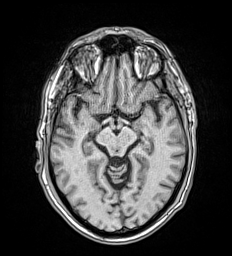
[im 81/175]
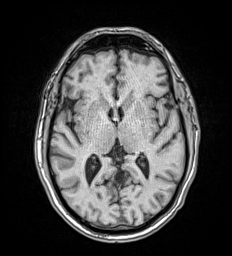
[im 94/175]
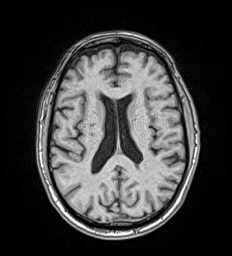
[im 108/175]
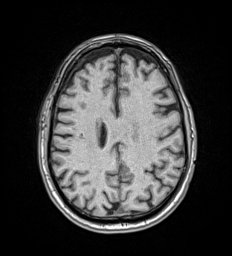
[im 121/175]
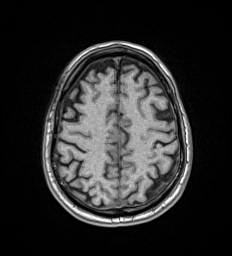
[im 134/175]
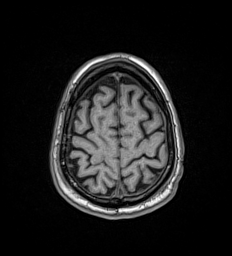
[im 148/175]
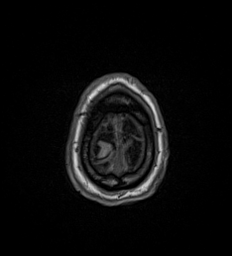
[im 161/175]
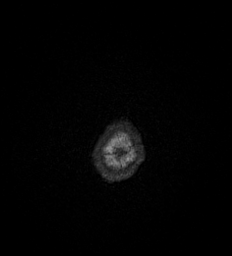
[im 175/175]
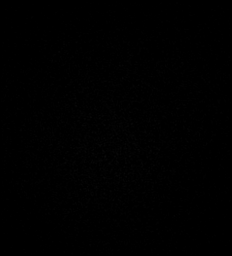

[Series 17: T2 · coronal · 5.0mm · 0.86mm/px · 3 of 31 slices shown (2 of 2)]
[im 1/31]
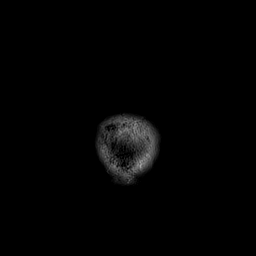
[im 16/31]
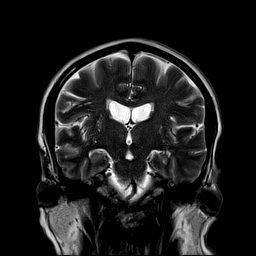
[im 31/31]
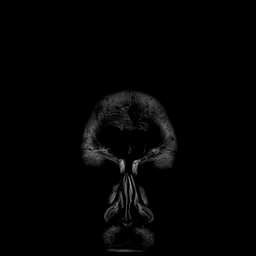

[48 of 48 positions shown; findings below may reference images not displayed]

FINDINGS: Brain: There is no acute infarction or intracranial hemorrhage.
There is no intracranial mass, mass effect, or edema. There is no
hydrocephalus or extra-axial fluid collection. Ventricles and sulci
are normal in size and configuration. Patchy foci of T2
hyperintensity in the supratentorial and pontine white matter are
nonspecific but may reflect mild chronic microvascular ischemic
changes. A punctate focus of susceptibility in the right frontal
subcortical white matter likely reflects chronic microhemorrhage.

Vascular: Major vessel flow voids at the skull base are preserved.

Skull and upper cervical spine: Normal marrow signal is preserved.

Sinuses/Orbits: Paranasal sinuses are aerated. Orbits are
unremarkable.

Other: Sella is unremarkable.  Mastoid air cells are clear.
IMPRESSION: No acute infarction, hemorrhage, or mass.

Mild chronic microvascular ischemic changes.

## 2021-06-16 MED ORDER — ACETAMINOPHEN 650 MG RE SUPP: 650.0000 mg | RECTAL | Status: DC | PRN

## 2021-06-16 MED ORDER — ASPIRIN 325 MG PO TABS
325.0000 mg | ORAL_TABLET | Freq: Every day | ORAL | Status: DC
  Administered 2021-06-17 (×2): 325 mg via ORAL
  Filled 2021-06-16 (×3): qty 1

## 2021-06-16 MED ORDER — HYDRALAZINE HCL 20 MG/ML IJ SOLN: 10.0000 mg | Freq: Once | INTRAMUSCULAR | Status: DC

## 2021-06-16 MED ORDER — STROKE: EARLY STAGES OF RECOVERY BOOK: Freq: Once | Status: AC

## 2021-06-16 MED ORDER — GADOBUTROL 1 MMOL/ML IV SOLN
10.0000 mL | Freq: Once | INTRAVENOUS | Status: AC | PRN
  Administered 2021-06-16: 22:00:00 10 mL via INTRAVENOUS

## 2021-06-16 MED ORDER — ASPIRIN 81 MG PO CHEW
650.0000 mg | CHEWABLE_TABLET | Freq: Once | ORAL | Status: AC
  Administered 2021-06-16: 19:00:00 650 mg via ORAL
  Filled 2021-06-16: qty 9

## 2021-06-16 MED ORDER — SODIUM CHLORIDE 0.9 % IV SOLN: INTRAVENOUS | Status: DC

## 2021-06-16 MED ORDER — HEPARIN SODIUM (PORCINE) 5000 UNIT/ML IJ SOLN
5000.0000 [IU] | Freq: Three times a day (TID) | INTRAMUSCULAR | Status: DC
  Administered 2021-06-16 – 2021-06-17 (×3): 5000 [IU] via SUBCUTANEOUS
  Filled 2021-06-16 (×3): qty 1

## 2021-06-16 MED ORDER — FEBUXOSTAT 40 MG PO TABS
40.0000 mg | ORAL_TABLET | Freq: Every day | ORAL | Status: DC
Start: 2021-06-16 — End: 2021-06-18
  Administered 2021-06-17: 09:00:00 40 mg via ORAL
  Filled 2021-06-16: qty 1

## 2021-06-16 MED ORDER — ATORVASTATIN CALCIUM 20 MG PO TABS
40.0000 mg | ORAL_TABLET | Freq: Every day | ORAL | Status: DC
  Administered 2021-06-17: 09:00:00 40 mg via ORAL
  Filled 2021-06-16: qty 2

## 2021-06-16 MED ORDER — ACETAMINOPHEN 160 MG/5ML PO SOLN
650.0000 mg | ORAL | Status: DC | PRN
  Filled 2021-06-16: qty 20.3

## 2021-06-16 MED ORDER — LOSARTAN POTASSIUM 50 MG PO TABS
50.0000 mg | ORAL_TABLET | Freq: Every day | ORAL | Status: DC
  Administered 2021-06-16: 17:00:00 50 mg via ORAL
  Filled 2021-06-16: qty 1

## 2021-06-16 MED ORDER — ACETAMINOPHEN 325 MG PO TABS
650.0000 mg | ORAL_TABLET | ORAL | Status: DC | PRN
  Administered 2021-06-16 – 2021-06-17 (×2): 650 mg via ORAL
  Filled 2021-06-16 (×2): qty 2

## 2021-06-16 NOTE — ED Triage Notes (Signed)
Pt here with blurred vision and hypertension that started at 1245 today. Pt also reports slurred speech but denies numbness or tingling. Pt in NAD in triage.

## 2021-06-16 NOTE — ED Provider Notes (Signed)
[facility] Provider Note    Event Date/Time   First MD Initiated Contact with Patient 06/16/21 1551     (approximate)   History   Blurred Vision and Hypertension   HPI  Steve Sampson is a 71 y.o. male who has a history of prostate cancer several decades ago, gout and high blood pressure as well as migraine headaches typically associated with vision changes which patient describes as "checkerboard" who presents for assessment of an episode that occurred earlier today which he described was typical of his usual migraine headaches including some mild pain in the front of his headache associate with typical vision changes but with new symptoms today including difficulty finding appropriate words and feeling weak in his hands and like he was dropping things.  He thinks this lasted between 30 minutes to 1 hour and occurred shortly after noon today.  He states he currently feels completely back to baseline.  He has never had speech changes or any weakness associate with these migraines.  States he went home and took ibuprofen and now has no headache vision changes and feels the speech is normal.  He does not know of any specific focal weakness in either hand but notes he was not able to hold onto things tightly.  He otherwise has been in his usual state health without any recent fevers, chills, cough, nausea, vomiting, diarrhea, rash, recent injuries or falls or any other acute concerns.  He states he takes his losartan regularly every evening.  No EtOH use, tobacco abuse or illicit drug use.  No other acute concerns at this time.      Physical Exam  Triage Vital Signs: ED Triage Vitals  Enc Vitals Group     BP 06/16/21 1429 (!) 208/92     Pulse Rate 06/16/21 1429 89     Resp 06/16/21 1429 18     Temp 06/16/21 1429 98.8 F (37.1 C)     Temp Source 06/16/21 1429 Oral     SpO2 06/16/21 1429 99 %     Weight 06/16/21 1430 252 lb (114.3 kg)     Height 06/16/21 1430 6'  (1.829 m)     Head Circumference --      Peak Flow --      Pain Score 06/16/21 1429 0     Pain Loc --      Pain Edu? --      Excl. in [place]? --     Most recent vital signs: Vitals:   06/16/21 1712 06/16/21 1800  BP: (!) 179/98 (!) 178/84  Pulse: 85 80  Resp: 17 16  Temp:    SpO2: 98% 98%    General: Awake, no distress.  CV:  Good peripheral perfusion.  Resp:  Normal effort.  Abd:  No distention.  Other:  Cranial nerves II through XII grossly intact.  No pronator drift.  No finger dysmetria.  Symmetric 5/5 strength of all extremities.  Sensation intact to light touch in all extremities.  Unremarkable unassisted gait.    ED Results / Procedures / Treatments  Labs (all labs ordered are listed, but only abnormal results are displayed) Labs Reviewed  BASIC METABOLIC PANEL - Abnormal; Notable for the following components:      Result Value   Glucose, Bld 152 (*)    All other components within normal limits  RESP PANEL BY RT-PCR (FLU A&B, COVID) ARPGX2  CBC  URINALYSIS, ROUTINE W REFLEX MICROSCOPIC  PROTIME-INR  CBG MONITORING, ED  TROPONIN I (HIGH SENSITIVITY)     EKG  EKG remarkable sinus rhythm with incomplete right bundle branch block, ventricular rate of 75 and nonspecific change in lead III without any other clear evidence of acute ischemia or significant arrhythmia.   RADIOLOGY   CT head shows no evidence of hemorrhage, ischemia, mass-effect or other clear acute process.  There is some evidence of atherosclerotic calcification.  I also reviewed radiology interpretation and agree with their findings.  MRI brain reviewed by myself show some chronic microvascular ischemic changes without any evidence of infarct or mass hemorrhage or edema.  Also reviewed radiology's interpretation and agree with the findings.   PROCEDURES:  Critical Care performed: No  Procedures    MEDICATIONS ORDERED IN ED: Medications  losartan (COZAAR) tablet 50 mg (50 mg Oral Given  06/16/21 1713)  aspirin chewable tablet 650 mg (has no administration in time range)     IMPRESSION / MDM Berneice Gandy an episode AND PLAN / ED COURSE  I reviewed the triage vital signs and the nursing notes.                              Differential diagnosis includes, but is not limited to CVA/TIA, hypertensive emergency, press, atypical migraine and metabolic derangements.  No history or exam features to suggest an acute infectious process or trauma.  Low suspicion for toxic ingestion or drug withdrawal.  EKG remarkable sinus rhythm with incomplete right bundle branch block, ventricular rate of 75 and nonspecific change in lead III without any other clear evidence of acute ischemia or significant arrhythmia.  CT head shows no evidence of hemorrhage, ischemia, mass-effect or other clear acute process.  There is some evidence of atherosclerotic calcification.  I also reviewed radiology interpretation and agree with their findings.  MRI brain reviewed by myself show some chronic microvascular ischemic changes without any evidence of infarct or mass hemorrhage or edema.  Also reviewed radiology's interpretation and agree with the findings.   CBC without leukocytosis or acute anemia.  BMP with a glucose of 150 without any other significant electrolyte or metabolic derangements.  Opponent nonelevated and overall ECG and history is not suggestive of ACS.  Discussed concern for possible TIA with on-call neurologist Dr. Cheral Marker who recommended loading with 650 mg of ASA admitting for TIA work-up.  I discussed this with admitting hospitalist who will place admission orders.      FINAL CLINICAL IMPRESSION(S) / ED DIAGNOSES   Final diagnoses:  Vision changes  Secondary hypertension  Spell of change in speech     Rx / DC Orders   ED Discharge Orders     None        Note:  This document was prepared using Dragon voice recognition software and may include unintentional dictation errors.    Lucrezia Starch, MD 06/16/21 [phone]

## 2021-06-16 NOTE — ED Notes (Signed)
Pt at MRI

## 2021-06-16 NOTE — ED Triage Notes (Signed)
First Nurse Note: Pt was at the store and got floaters in his eyes he gets these when he has a migraine coming on, he took Ibuprofen and it went away. He then tried to get out what he wanted to say, he knew what he wanted to say but could not get it out. He called his PMD office and they told him to come to get evaluated. Pt is not having any symptoms at this time. His dad had TIAs and was not sure if that is what he is having or if it is hereditary.

## 2021-06-16 NOTE — ED Provider Triage Note (Signed)
Emergency Medicine Provider Triage Evaluation Note  Steve Sampson, a 71 y.o. male  was evaluated in triage.  Pt complains of visual disturbance and headache consistent with his history of what sounds like (ocular) migraines.  Patient took ibuprofen and presents in no acute distress.  There was a moment according to his wife, when he verbalized that he was confused, knowing what he wanted to say but having a tough time searching for the words.  There was no reported weakness, slurred speech, or paralysis.  Review of Systems  Positive: Headache, blurry vision Negative: Paralysis, slurred speech  Physical Exam  BP (!) 208/92 (BP Location: Left Arm)    Pulse 89    Temp 98.8 F (37.1 C) (Oral)    Resp 18    Ht 6' (1.829 m)    Wt 114.3 kg    SpO2 99%    BMI 34.18 kg/m  Gen:   Awake, no distress   Resp:  Normal effort  MSK:   Moves extremities without difficulty  Other:  CVS: RRR  Medical Decision Making  Medically screening exam initiated at 2:55 PM.  Appropriate orders placed.  Steve Sampson was informed that the remainder of the evaluation will be completed by another provider, this initial triage assessment does not replace that evaluation, and the importance of remaining in the ED until their evaluation is complete.  ED evaluation of now resolved headache and visual disturbance related history of migraines.   Steve Needles, PA-C 06/16/21 1457

## 2021-06-16 NOTE — H&P (Addendum)
History and Physical    Steve Sampson JKK:[id number] DOB: 1951-01-29 DOA: 06/16/2021  PCP: Steve Crouch, MD    Patient coming from:  Home    Chief Complaint:  Blurred vision    HPI:  Steve Sampson is a 71 y.o. male seen in ed with complaints of blurred vision and Htn, started around noon, and slurred speech while ordering a hot dog, he was aware of inability to speak or say anything- 1:30PM , pt at home was knocking things over. Pt also had checkerboard vision and has had it few times before - and did not see md, he does not have glaucoma.  Pt was also reporting weakness. Pt had covid last year in august.   Pt has past medical history of HTN, gout, allergies to red maple, history of prostate cancer.  ED Course:   Vitals:   06/16/21 1712 06/16/21 1800 06/16/21 1830 06/16/21 1955  BP: (!) 179/98 (!) 178/84 (!) 184/94 (!) 166/89  Pulse: 85 80 78 77  Resp: 17 16 18 18   Temp:    98.6 F (37 C)  TempSrc:    Oral  SpO2: 98% 98% 97% 98%  Weight:      Height:      In the emergency room patient is alert awake oriented afebrile and oxygenating 99% on room air.   Review of Systems:  Review of Systems  Constitutional: Negative.   HENT: Negative.    Eyes: Negative.        Checkerboard - spots   Respiratory: Negative.    Cardiovascular:  Positive for leg swelling.  Gastrointestinal: Negative.   Genitourinary: Negative.   Musculoskeletal: Negative.   Skin: Negative.   Neurological:  Positive for speech change.  Endo/Heme/Allergies: Negative.   Psychiatric/Behavioral: Negative.    All other systems reviewed and are negative.    Past Medical History:  Diagnosis Date   Arthritis    Bilateral hearing loss    ED (erectile dysfunction)    Gout    Hypertension    Nocturia    Prostate cancer [facility])     Past Surgical History:  Procedure Laterality Date   CHOLECYSTECTOMY     KNEE SURGERY Right    seed implantation prostate     TONSILECTOMY, ADENOIDECTOMY, BILATERAL  MYRINGOTOMY AND TUBES       reports that he has never smoked. He has never used smokeless tobacco. He reports that he does not drink alcohol and does not use drugs.  Allergies  Allergen Reactions   Red Maple (Acer Rubrum) Allergy Skin Test     Other reaction(s): Other (See Comments) Flu like symptoms    Family History  Problem Relation Age of Onset   Liver cancer Mother    Prostate cancer Paternal Grandfather    Colon cancer Other    Esophageal cancer Other     Prior to Admission medications   Medication Sig Start Date End Date Taking? Authorizing Provider  busPIRone (BUSPAR) 10 MG tablet Take 10 mg by mouth 2 (two) times daily. 02/24/21   [provider]  febuxostat (ULORIC) 40 MG tablet febuxostat 40 mg tablet  TAKE 1 TABLET (40 MG TOTAL) BY MOUTH ONCE DAILY FOR 30 DAYS 07/24/20   [provider]  Ibuprofen 200 MG CAPS ibuprofen 200 mg capsule    [provider]  ketoconazole (NIZORAL) 2 % cream Apply to the face and feet QHS. 02/25/21   Ralene Bathe, MD  ketoconazole (NIZORAL) 2 % shampoo Shampoo from the neck  up let sit a few minutes then wash off. Use 3d/wk. 02/25/21   Ralene Bathe, MD  losartan (COZAAR) 50 MG tablet Take 1 tablet by mouth daily. 01/08/21 01/08/22  [provider]    Physical Exam: Vitals:   06/16/21 1712 06/16/21 1800 06/16/21 1830 06/16/21 1955  BP: (!) 179/98 (!) 178/84 (!) 184/94 (!) 166/89  Pulse: 85 80 78 77  Resp: 17 16 18 18   Temp:    98.6 F (37 C)  TempSrc:    Oral  SpO2: 98% 98% 97% 98%  Weight:      Height:       Physical Exam Constitutional:      General: He is not in acute distress.    Appearance: He is obese. He is not ill-appearing.  HENT:     Head: Normocephalic and atraumatic.     Right Ear: External ear normal.     Left Ear: External ear normal.     Nose: Nose normal.     Mouth/Throat:     Mouth: Mucous membranes are moist.  Eyes:     Extraocular Movements: Extraocular movements  intact.     Pupils: Pupils are equal, round, and reactive to light.  Neck:     Vascular: No carotid bruit.  Cardiovascular:     Rate and Rhythm: Normal rate and regular rhythm.     Pulses: Normal pulses.     Heart sounds: Normal heart sounds.  Pulmonary:     Effort: Pulmonary effort is normal.     Breath sounds: Normal breath sounds.  Abdominal:     General: Bowel sounds are normal. There is no distension.     Palpations: Abdomen is soft. There is no mass.     Tenderness: There is no abdominal tenderness. There is no guarding.     Hernia: No hernia is present.  Musculoskeletal:     Right lower leg: Edema present.     Left lower leg: Edema present.  Neurological:     General: No focal deficit present.     Mental Status: He is alert and oriented to person, place, and time.     Cranial Nerves: No cranial nerve deficit.     Motor: No weakness.     Deep Tendon Reflexes: Reflexes normal.  Psychiatric:        Mood and Affect: Mood normal.        Behavior: Behavior normal.    Labs on Admission: I have personally reviewed following labs and imaging studies No results for input(s): CKTOTAL, CKMB, TROPONINI in the last 72 hours. Lab Results  Component Value Date   WBC 5.7 06/16/2021   HGB 14.9 06/16/2021   HCT 45.1 06/16/2021   MCV 83.1 06/16/2021   PLT 189 06/16/2021    Recent Labs  Lab 06/16/21 1431  NA 139  K 3.9  CL 104  CO2 25  BUN 21  CREATININE 1.06  CALCIUM 9.6  GLUCOSE 152*   No results found for: CHOL, HDL, LDLCALC, TRIG No results found for: DDIMER Invalid input(s): POCBNP   COVID-19 Labs No results for input(s): DDIMER, FERRITIN, LDH, CRP in the last 72 hours. Lab Results  Component Value Date   [place] NEGATIVE 06/16/2021    Radiological Exams on Admission: CT HEAD WO CONTRAST  Result Date: 06/16/2021 CLINICAL DATA:  Dizziness, nonspecific. Blurred vision and hypertension. Slurred speech. EXAM: CT HEAD WITHOUT CONTRAST TECHNIQUE: Contiguous  axial images were obtained from the base of the skull through the vertex  without intravenous contrast. RADIATION DOSE REDUCTION: This exam was performed according to the departmental dose-optimization program which includes automated exposure control, adjustment of the mA and/or kV according to patient size and/or use of iterative reconstruction technique. COMPARISON:  None. FINDINGS: Brain: No focal abnormality seen affecting the brainstem or cerebellum. Cerebral hemispheres show mild age related volume loss without evidence of old or acute focal infarction, mass lesion, hemorrhage, hydrocephalus or extra-axial collection. Vascular: There is atherosclerotic calcification of the major vessels at the base of the brain. Skull: Negative Sinuses/Orbits: Clear/normal Other: None IMPRESSION: No focal or acute brain finding. Mild age related volume loss. Atherosclerotic calcification of the major vessels at the base of the brain, usually seen at this age. Electronically Signed   By: Nelson Chimes M.D.   On: 06/16/2021 15:07   MR BRAIN WO CONTRAST  Result Date: 06/16/2021 CLINICAL DATA:  Neuro deficit, acute, stroke suspected EXAM: MRI HEAD WITHOUT CONTRAST TECHNIQUE: Multiplanar, multiecho pulse sequences of the brain and surrounding structures were obtained without intravenous contrast. COMPARISON:  None. FINDINGS: Brain: There is no acute infarction or intracranial hemorrhage. There is no intracranial mass, mass effect, or edema. There is no hydrocephalus or extra-axial fluid collection. Ventricles and sulci are normal in size and configuration. Patchy foci of T2 hyperintensity in the supratentorial and pontine white matter are nonspecific but may reflect mild chronic microvascular ischemic changes. A punctate focus of susceptibility in the right frontal subcortical white matter likely reflects chronic microhemorrhage. Vascular: Major vessel flow voids at the skull base are preserved. Skull and upper cervical spine:  Normal marrow signal is preserved. Sinuses/Orbits: Paranasal sinuses are aerated. Orbits are unremarkable. Other: Sella is unremarkable.  Mastoid air cells are clear. IMPRESSION: No acute infarction, hemorrhage, or mass. Mild chronic microvascular ischemic changes. Electronically Signed   By: Macy Mis M.D.   On: 06/16/2021 17:23    EKG: Independently reviewed.  SR 75 with incomplete RBBB.   Assessment/Plan: Principal Problem:   Slurred speech Active Problems:   HTN, goal below 140/90   Chronic gouty arthritis   Slurred speech:  Pt presenting with vision issue with slurred speech highly concerning fort TIA and or cva.  We will start pt on aspirin 324 mg daily after her initial bolus.  Lipitor 40 mg and lipid panel. Permissive htn- hold hctz. Lisinopril, Benicar. Prn hydralazine for sbp 200 and above.   Hypertension: Permissive hypertension with a goal of treatment systolic blood pressure of 200 and above with as needed hydralazine.  Chronic gouty arthritis patient continued on his Uloric DVT prophylaxis:  Heparin  Code Status:  Full code  Family CommunicationJandel Patriarca, MENDA (Spouse)  [phone] (Mobile)   Disposition Plan:  Home  Consults called:  None  Admission status: Inpatient   Medical Decision Making   Coding    Para Skeans MD [facility]  6 PM- 2 AM. Please contact me via secure Chat 6 PM-2 AM. To contact the [facility] Attending or Consulting provider [place] or covering provider during after hours [place], for this patient.   Check the care team in [facility] and look for a) attending/consulting [facility] provider listed and b) the [facility] team listed Log into [email or web address] and use [place]'s universal password to access. If you do not have the password, please contact the hospital operator. Locate the [facility] provider you are looking for under [facility] and page to a number that you can be directly reached. If you still have difficulty reaching the  provider, please page the [facility] (Director on Call) for the Hospitalists listed on amion for assistance. [email or web address] 06/16/2021, 8:16 PM

## 2021-06-17 ENCOUNTER — Inpatient Hospital Stay (HOSPITAL_BASED_OUTPATIENT_CLINIC_OR_DEPARTMENT_OTHER)
Admit: 2021-06-17 | Discharge: 2021-06-17 | Disposition: A | Discharge status: Home / Self Care | Payer: Medicare PPO | Attending: Internal Medicine | Admitting: Internal Medicine

## 2021-06-17 ENCOUNTER — Observation Stay: Payer: Medicare PPO

## 2021-06-17 DIAGNOSIS — I10 Essential (primary) hypertension: Secondary | ICD-10-CM

## 2021-06-17 DIAGNOSIS — G459 Transient cerebral ischemic attack, unspecified: Secondary | ICD-10-CM

## 2021-06-17 DIAGNOSIS — M1A00X0 Idiopathic chronic gout, unspecified site, without tophus (tophi): Secondary | ICD-10-CM

## 2021-06-17 DIAGNOSIS — R4781 Slurred speech: Secondary | ICD-10-CM | POA: Diagnosis not present

## 2021-06-17 DIAGNOSIS — I160 Hypertensive urgency: Secondary | ICD-10-CM

## 2021-06-17 LAB — CBC WITH DIFFERENTIAL/PLATELET
Abs Immature Granulocytes: 0.01 10*3/uL (ref 0.00–0.07)
Basophils Absolute: 0.1 10*3/uL (ref 0.0–0.1)
Basophils Relative: 1 %
Eosinophils Absolute: 0.2 10*3/uL (ref 0.0–0.5)
Eosinophils Relative: 3 %
HCT: 45.1 % (ref 39.0–52.0)
Hemoglobin: 14.9 g/dL (ref 13.0–17.0)
Immature Granulocytes: 0 %
Lymphocytes Relative: 30 %
Lymphs Abs: 1.7 10*3/uL (ref 0.7–4.0)
MCH: 26.9 pg (ref 26.0–34.0)
MCHC: 33 g/dL (ref 30.0–36.0)
MCV: 81.4 fL (ref 80.0–100.0)
Monocytes Absolute: 0.6 10*3/uL (ref 0.1–1.0)
Monocytes Relative: 10 %
Neutro Abs: 3.2 10*3/uL (ref 1.7–7.7)
Neutrophils Relative %: 56 %
Platelets: 175 10*3/uL (ref 150–400)
RBC: 5.54 MIL/uL (ref 4.22–5.81)
RDW: 13 % (ref 11.5–15.5)
WBC: 5.7 10*3/uL (ref 4.0–10.5)
nRBC: 0 % (ref 0.0–0.2)

## 2021-06-17 LAB — LIPID PANEL
Cholesterol: 138 mg/dL (ref 0–200)
HDL: 27 mg/dL — ABNORMAL LOW (ref >40)
LDL Cholesterol: 82 mg/dL (ref 0–99)
Total CHOL/HDL Ratio: 5.1 RATIO
Triglycerides: 147 mg/dL (ref <150)
VLDL: 29 mg/dL (ref 0–40)

## 2021-06-17 LAB — PHOSPHORUS: Phosphorus: 3.7 mg/dL (ref 2.5–4.6)

## 2021-06-17 LAB — ECHOCARDIOGRAM COMPLETE BUBBLE STUDY
AR max vel: 2.18 cm2
AV Area VTI: 2.6 cm2
AV Area mean vel: 2.44 cm2
AV Mean grad: 3 mmHg
AV Peak grad: 5.3 mmHg
Ao pk vel: 1.15 m/s
Area-P 1/2: 4.06 cm2
MV VTI: 2.38 cm2
S' Lateral: 2.93 cm

## 2021-06-17 LAB — COMPREHENSIVE METABOLIC PANEL
ALT: 29 U/L (ref 0–44)
AST: 24 U/L (ref 15–41)
Albumin: 4.4 g/dL (ref 3.5–5.0)
Alkaline Phosphatase: 43 U/L (ref 38–126)
Anion gap: 10 (ref 5–15)
BUN: 18 mg/dL (ref 8–23)
CO2: 25 mmol/L (ref 22–32)
Calcium: 9.2 mg/dL (ref 8.9–10.3)
Chloride: 104 mmol/L (ref 98–111)
Creatinine, Ser: 1.05 mg/dL (ref 0.61–1.24)
GFR, Estimated: >60 mL/min (ref >60)
Glucose, Bld: 113 mg/dL — ABNORMAL HIGH (ref 70–99)
Potassium: 3.9 mmol/L (ref 3.5–5.1)
Sodium: 139 mmol/L (ref 135–145)
Total Bilirubin: 0.8 mg/dL (ref 0.3–1.2)
Total Protein: 7.4 g/dL (ref 6.5–8.1)

## 2021-06-17 LAB — URINALYSIS, ROUTINE W REFLEX MICROSCOPIC
Bacteria, UA: NONE SEEN
Bilirubin Urine: NEGATIVE
Glucose, UA: NEGATIVE mg/dL
Hgb urine dipstick: NEGATIVE
Ketones, ur: NEGATIVE mg/dL
Leukocytes,Ua: NEGATIVE
Nitrite: NEGATIVE
Protein, ur: NEGATIVE mg/dL
Specific Gravity, Urine: 1.025 (ref 1.005–1.030)
Squamous Epithelial / HPF: NONE SEEN (ref 0–5)
pH: 6 (ref 5.0–8.0)

## 2021-06-17 LAB — HIV ANTIBODY (ROUTINE TESTING W REFLEX): HIV Screen 4th Generation wRfx: NONREACTIVE

## 2021-06-17 LAB — MAGNESIUM: Magnesium: 1.9 mg/dL (ref 1.7–2.4)

## 2021-06-17 IMAGING — MR MR MRA HEAD W/O CM
1 series · 20 of 48 positions shown · non-contrast
Comparison: Neck MRA [DATE].

CLINICAL DATA: Transient ischemic attack (TIA).

EXAM:
MRA HEAD WITHOUT CONTRAST
TECHNIQUE: Angiographic images of the Circle of Willis were acquired using MRA
technique without intravenous contrast.

[Series 5: TOF · axial · 0.5mm · 0.41mm/px · z∈[-90,+7]mm · 20 of 205 slices shown]
[im 1/205]
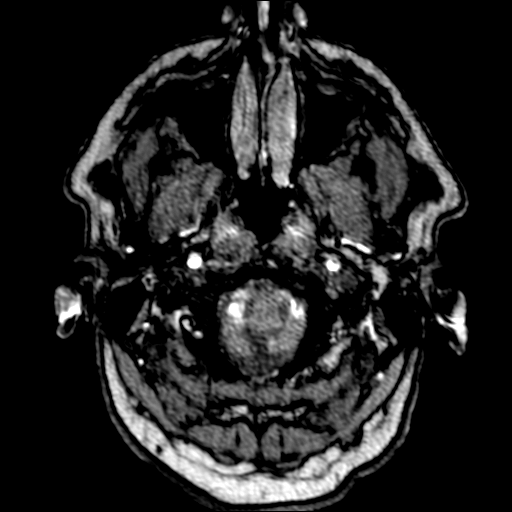
[im 5/205]
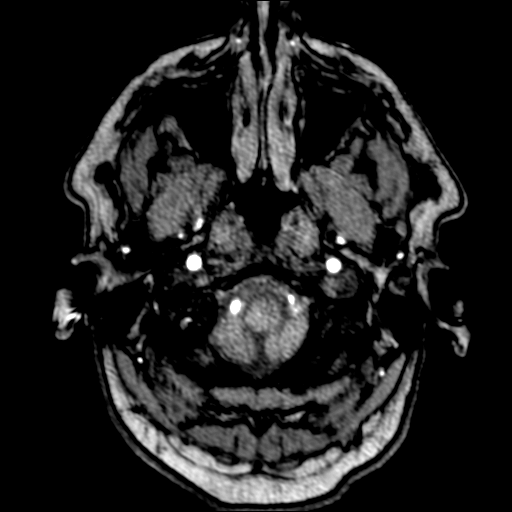
[im 9/205]
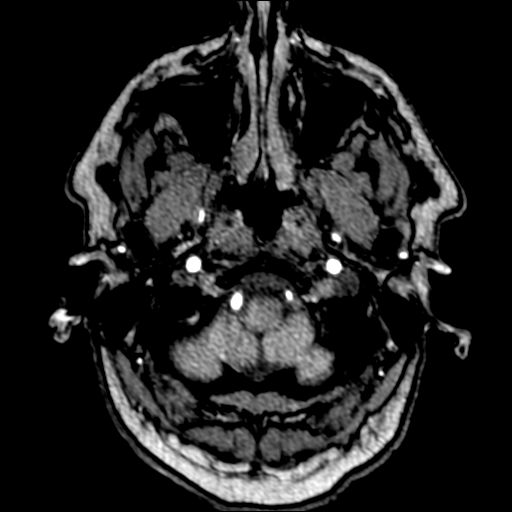
[im 14/205]
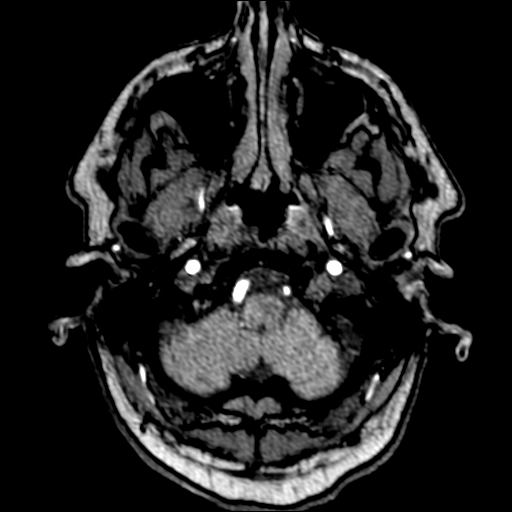
[im 18/205]
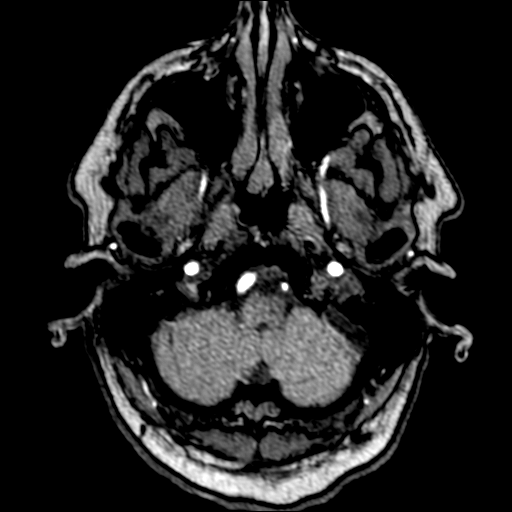
[im 22/205]
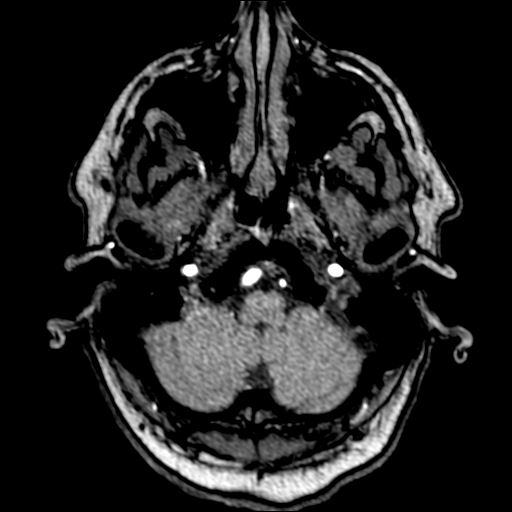
[im 27/205]
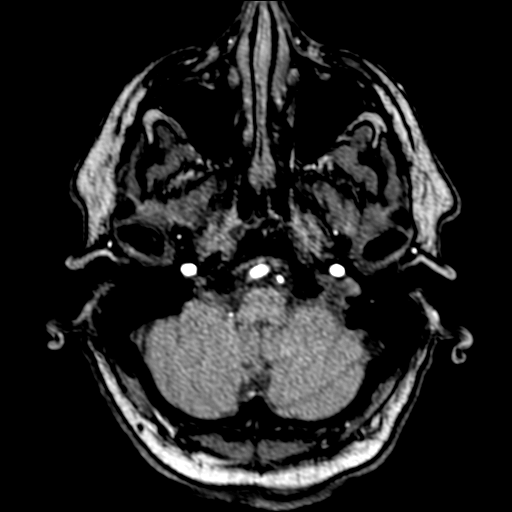
[im 31/205]
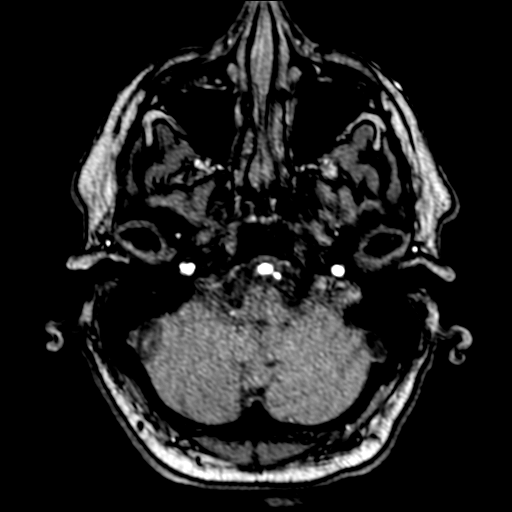
[im 35/205]
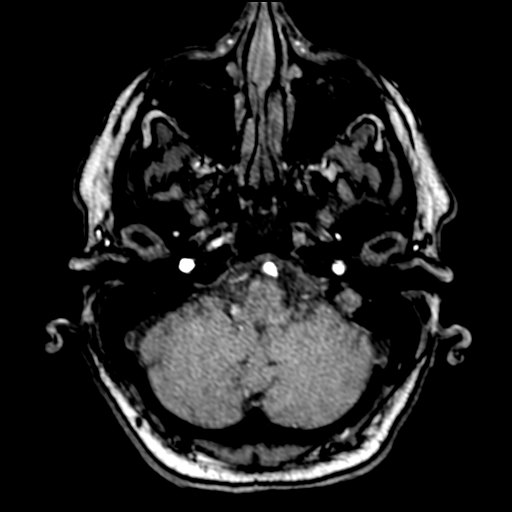
[im 40/205]
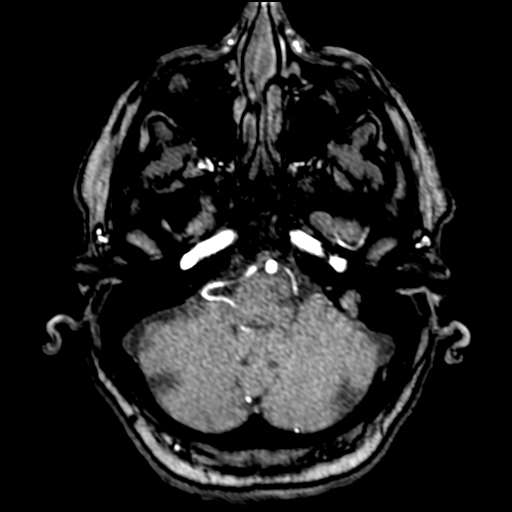
[im 44/205]
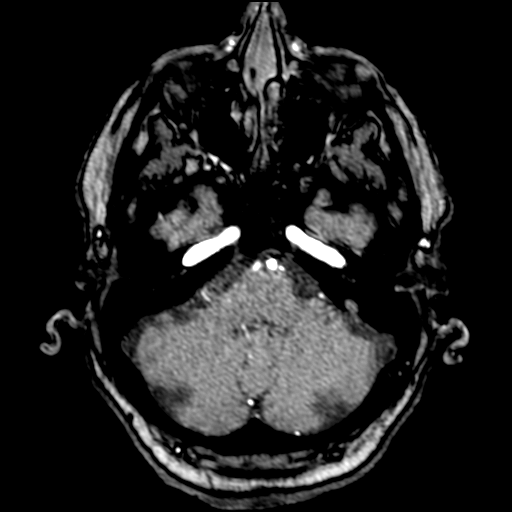
[im 48/205]
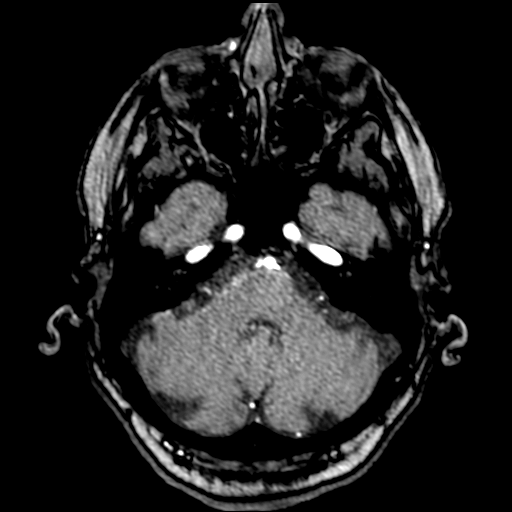
[im 66/205]
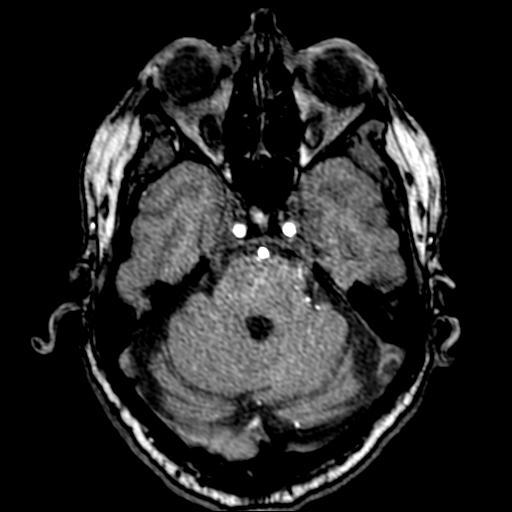
[im 92/205]
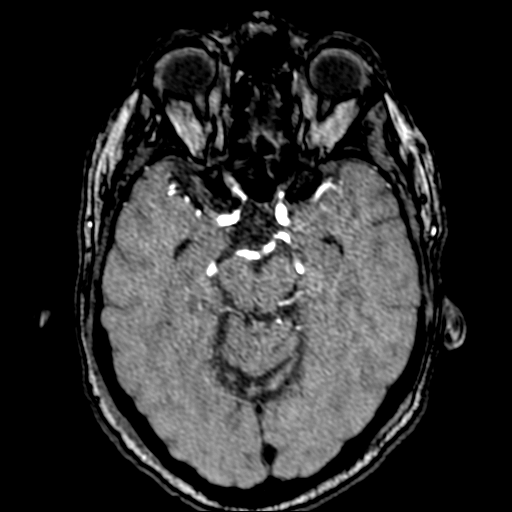
[im 105/205]
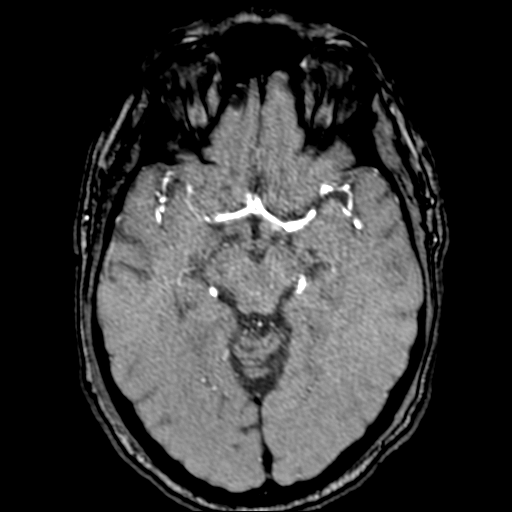
[im 118/205]
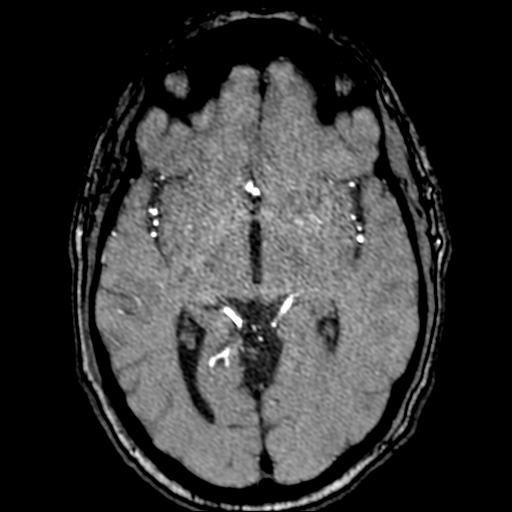
[im 144/205]
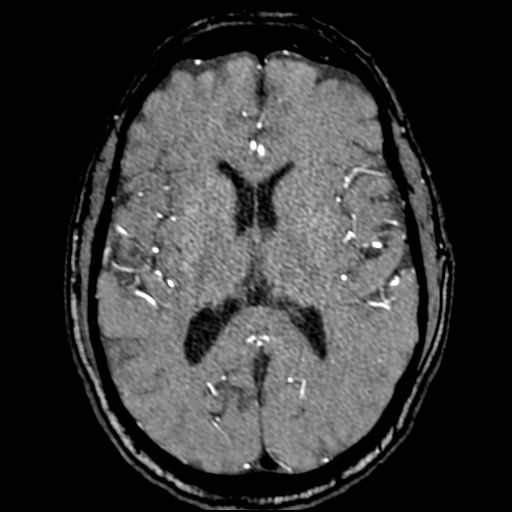
[im 170/205]
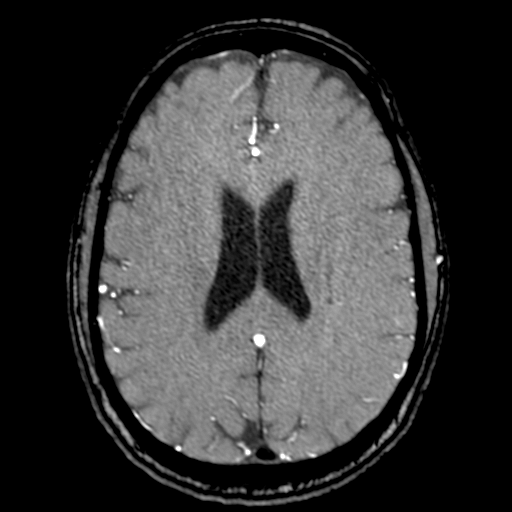
[im 174/205]
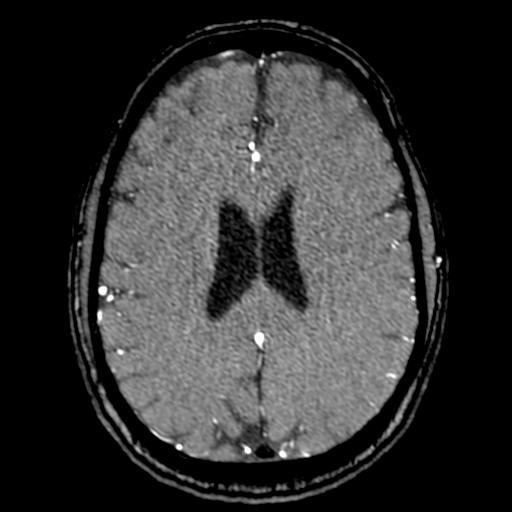
[im 196/205]
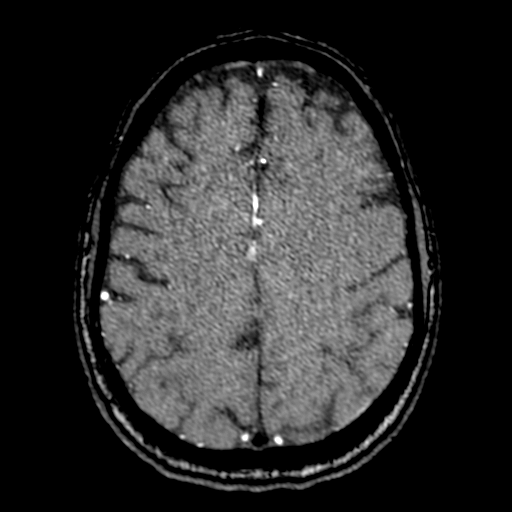

[20 of 48 positions shown; findings below may reference images not displayed]

FINDINGS: Anterior circulation: The internal carotid arteries are patent from
skull base to carotid termini without evidence of a significant
stenosis within limitations of motion artifact (and without evidence
of a significant stenosis on yesterday's neck MRA). ACAs and MCAs
are patent without evidence of a proximal branch occlusion or
significant proximal stenosis. No aneurysm is identified.

Posterior circulation: The included intracranial vertebral arteries
are widely patent to the basilar with the right being dominant. The
basilar artery is widely patent. Posterior communicating arteries
are diminutive or absent. Both PCAs are patent without evidence of a
significant proximal stenosis. No aneurysm is identified.

Anatomic variants: None.
IMPRESSION: Negative head MRA.

## 2021-06-17 MED ORDER — ATORVASTATIN CALCIUM 40 MG PO TABS: 40.0000 mg | ORAL_TABLET | Freq: Every day | ORAL | 0 refills | Status: AC

## 2021-06-17 MED ORDER — ASPIRIN EC 81 MG PO TBEC: 81.0000 mg | DELAYED_RELEASE_TABLET | Freq: Every day | ORAL | 2 refills | Status: AC

## 2021-06-17 NOTE — Discharge Summary (Addendum)
Physician Discharge Summary  Steve Sampson MWN:[id number] DOB: [birth year] DOA: 06/16/2021  PCP: [name], MD  Admit date: 06/16/2021 Discharge date: 06/17/2021  Admitted From: Home Disposition:  Home   Recommendations for Outpatient Follow-up:  Follow up with PCP in 1-2 weeks  Follow up with Neurology within 4 weeks Please obtain CMP/CBC, Mag, Phos in one week Please follow up on the following pending results:  Home Health: No  Equipment/Devices: None    Discharge Condition: Stable  CODE STATUS: FULL CODE  Diet recommendation:   Brief/Interim Summary: The patient is a 71 year old obese Caucasian male with a past medical history significant for but not limited to hypertension, gout, 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  There is also some complaints of some blurred vision but patient denies this.  He is also reporting weakness which is now resolved.  He had COVID in August of last year and has improved.  He presented with slurred speech with a concern for TIA versus CVA and MRI was done and showed no evidence of CVA.  He is currently undergoing a TIA work-up and neurology has been consulted and recommending an MRA of the head which is still pending to be done.  Echocardiogram has been done and SLP and PT OT recommending no follow-up.  Patient feels that he is back to baseline.  Neurology feels his symptoms could have been related to uncontrolled hypertension or a complicated migraine rather than a TIA but still wanted to obtain an MRA of the brain.  Neurology recommended continuing aspirin 81 mg p.o. daily as well as atorvastatin and risk factor modification.  Discharge Diagnoses:  Principal Problem:   Slurred speech Active Problems:   HTN, goal below 140/90   Chronic gouty arthritis  Transient  neurological symptoms with slurred speech and left hand weakness -Improved and back to baseline -Turn for TIA versus CVA but MRI of the brain showed no acute infarction, hemorrhage or mass -CT head done and showed no focal acute brain findings but did show mild age related volume loss and atherosclerotic calcification of the vessels at the base of the brain he was seen at this age -MRI of the brain as above and did show chronic microvascular ischemic changes -MRA of the neck showed no hemodynamically significant stenosis in the neck -Transthoracic echocardiogram done and showed no mural thrombus or valvular vegetation mentioned report and the bubble study was nondiagnostic for intracardiac shunting and an EF of 50 to 55% and grade 1 diastolic dysfunction -Neurology feels that this could be related to his hypertensive urgency and less likely a a complicated migraine; neurology feels that the patient could have a TIA as well but recommends continue aspirin 81 mg p.o. daily and atorvastatin -Neurology recommends obtaining MRA of the head which is still pending be done as well as risk factor modification; later on the MRA came back and was read as a negative MRA -Lipid panel done and hemoglobin A1c still pending -Lipid panel showed a total cholesterol/HDL ratio 5.1, cholesterol level 130, HDL 27, LDL of 82, triglycerides 147, VLDL 29 -Received aspirin 324 mg daily and then started on 325 mg daily as well as atorvastatin 40 mg p.o. daily; neurology recommended 81 mg of aspirin daily at discharge -PT OT and SLP consults done and recommend no follow-up -Neurologist to follow-up  with the patient outpatient setting and will follow up with the [facility]   Uncontrolled hypertension -Initially had permissive hypertension but now will slowly add back in medication -Continue monitor blood pressures per protocol -We will resume his hydrochlorothiazide, lisinopril and Benicar as well as continuing  hydralazine -Last blood pressure reading was 179/91 prior to discharge -We will getting IV fluid hydration with 50 MLS per hour which will now stop -Resume his home antihypertensives   Hyperglycemia in the setting of PreDiabetes -Checking hemoglobin A1c  was pending but resulted at 6.0; blood sugar on admission was 152 and repeat this morning was 113 -Continue monitor CBGs carefully and follow-up in outpatient setting with PCP for further blood sugar management and evaluation    Gout -Continue with Febuxostat 40 mg p.o. daily but apparently no longer taking this so we will discontinue at discharge   Obesity -Complicates overall prognosis and care -Estimated body mass index is 34.18 kg/m as calculated from the following:   Height as of this encounter: 6' (1.829 m).   Weight as of this encounter: 114.3 kg.  -Weight Loss and Dietary Counseling given  Discharge Instructions  Discharge Instructions     Call MD for:  difficulty breathing, headache or visual disturbances   Complete by: As directed    Call MD for:  extreme fatigue   Complete by: As directed    Call MD for:  hives   Complete by: As directed    Call MD for:  persistant dizziness or light-headedness   Complete by: As directed    Call MD for:  persistant nausea and vomiting   Complete by: As directed    Call MD for:  redness, tenderness, or signs of infection (pain, swelling, redness, odor or green/yellow discharge around incision site)   Complete by: As directed    Call MD for:  severe uncontrolled pain   Complete by: As directed    Call MD for:  temperature >100.4   Complete by: As directed    Diet - low sodium heart healthy   Complete by: As directed    Discharge instructions   Complete by: As directed    You were cared for by a hospitalist during your hospital stay. If you have any questions about your discharge medications or the care you received while you were in the hospital after you are discharged, you can  call the unit and ask to speak with the hospitalist on call if the hospitalist that took care of you is not available. Once you are discharged, your primary care physician will handle any further medical issues. Please note that NO REFILLS for any discharge medications will be authorized once you are discharged, as it is imperative that you return to your primary care physician (or establish a relationship with a primary care physician if you do not have one) for your aftercare needs so that they can reassess your need for medications and monitor your lab values.  Follow up with PCP and Neurology as an outpatient. Take all medications as prescribed. If symptoms change or worsen please return to the ED for evaluation   Increase activity slowly   Complete by: As directed       Allergies as of 06/17/2021       Reactions   Red Maple (acer Rubrum) Allergy Skin Test    Other reaction(s): Other (See Comments) Flu like symptoms        Medication List     STOP taking these medications  busPIRone 10 MG tablet Commonly known as: BUSPAR   febuxostat 40 MG tablet Commonly known as: ULORIC   hydrochlorothiazide 25 MG tablet Commonly known as: HYDRODIURIL   Ibuprofen 200 MG Caps   ketoconazole 2 % cream Commonly known as: NIZORAL   ketoconazole 2 % shampoo Commonly known as: NIZORAL   lisinopril 30 MG tablet Commonly known as: ZESTRIL   olmesartan 20 MG tablet Commonly known as: BENICAR       TAKE these medications    aspirin EC 81 MG tablet Take 1 tablet (81 mg total) by mouth daily. Swallow whole.   atorvastatin 40 MG tablet Commonly known as: LIPITOR Take 1 tablet (40 mg total) by mouth daily. Start taking on: June 18, 2021   losartan 50 MG tablet Commonly known as: COZAAR Take 1 tablet by mouth daily.   silodosin 8 MG Caps capsule Commonly known as: RAPAFLO Take 8 mg by mouth daily with breakfast.        Follow-up Information     [name], MD.  Call.   Specialty: Internal Medicine Why: Follow up within 1-2 weeks Contact information: [place] [address] [phone]         Vladimir Crofts, MD Follow up.   Specialty: Neurology Contact information: [facility] [place] [address] [phone]                Allergies  Allergen Reactions   Red Maple (Acer Rubrum) Allergy Skin Test     Other reaction(s): Other (See Comments) Flu like symptoms    Consultations: Neurology  Procedures/Studies: CT HEAD WO CONTRAST  Result Date: 06/16/2021 CLINICAL DATA:  Dizziness, nonspecific. Blurred vision and hypertension. Slurred speech. EXAM: CT HEAD WITHOUT CONTRAST TECHNIQUE: Contiguous axial images were obtained from the base of the skull through the vertex without intravenous contrast. RADIATION DOSE REDUCTION: This exam was performed according to the departmental dose-optimization program which includes automated exposure control, adjustment of the mA and/or kV according to patient size and/or use of iterative reconstruction technique. COMPARISON:  None. FINDINGS: Brain: No focal abnormality seen affecting the brainstem or cerebellum. Cerebral hemispheres show mild age related volume loss without evidence of old or acute focal infarction, mass lesion, hemorrhage, hydrocephalus or extra-axial collection. Vascular: There is atherosclerotic calcification of the major vessels at the base of the brain. Skull: Negative Sinuses/Orbits: Clear/normal Other: None IMPRESSION: No focal or acute brain finding. Mild age related volume loss. Atherosclerotic calcification of the major vessels at the base of the brain, usually seen at this age. Electronically Signed   By: Nelson Chimes M.D.   On: 06/16/2021 15:07   MR ANGIO HEAD WO CONTRAST  Result Date: 06/17/2021 CLINICAL DATA:  Transient ischemic attack (TIA). EXAM: MRA HEAD WITHOUT CONTRAST TECHNIQUE:  Angiographic images of the Circle of Willis were acquired using MRA technique without intravenous contrast. COMPARISON:  Neck MRA 06/16/2021. FINDINGS: Anterior circulation: The internal carotid arteries are patent from skull base to carotid termini without evidence of a significant stenosis within limitations of motion artifact (and without evidence of a significant stenosis on yesterday's neck MRA). ACAs and MCAs are patent without evidence of a proximal branch occlusion or significant proximal stenosis. No aneurysm is identified. Posterior circulation: The included intracranial vertebral arteries are widely patent to the basilar with the right being dominant. The basilar artery is widely patent. Posterior communicating arteries are diminutive or absent. Both PCAs are patent without evidence of  a significant proximal stenosis. No aneurysm is identified. Anatomic variants: None. IMPRESSION: Negative head MRA. Electronically Signed   By: Logan Bores M.D.   On: 06/17/2021 18:37   MR ANGIO NECK W WO CONTRAST  Result Date: 06/16/2021 CLINICAL DATA:  TIA EXAM: MRA NECK WITHOUT AND WITH CONTRAST TECHNIQUE: Multiplanar and multiecho pulse sequences of the neck were obtained without and with intravenous contrast. Angiographic images of the neck were obtained using MRA technique without and with intravenous contrast. CONTRAST:  30mL GADAVIST GADOBUTROL 1 MMOL/ML IV SOLN COMPARISON:  None. FINDINGS: Normal aortic branching. No evidence of dissection or significant stenosis of the origins of the branch vessels. Common, internal, and external carotid arteries are patent, without hemodynamically significant stenosis. Extracranial vertebral arteries are patent, without hemodynamically significant stenosis. The left vertebral artery is diminutive from its origin to the terminus. Right dominant system. IMPRESSION: No hemodynamically significant stenosis in the neck. Electronically Signed   By: Merilyn Baba M.D.   On:  06/16/2021 22:38   MR BRAIN WO CONTRAST  Result Date: 06/16/2021 CLINICAL DATA:  Neuro deficit, acute, stroke suspected EXAM: MRI HEAD WITHOUT CONTRAST TECHNIQUE: Multiplanar, multiecho pulse sequences of the brain and surrounding structures were obtained without intravenous contrast. COMPARISON:  None. FINDINGS: Brain: There is no acute infarction or intracranial hemorrhage. There is no intracranial mass, mass effect, or edema. There is no hydrocephalus or extra-axial fluid collection. Ventricles and sulci are normal in size and configuration. Patchy foci of T2 hyperintensity in the supratentorial and pontine white matter are nonspecific but may reflect mild chronic microvascular ischemic changes. A punctate focus of susceptibility in the right frontal subcortical white matter likely reflects chronic microhemorrhage. Vascular: Major vessel flow voids at the skull base are preserved. Skull and upper cervical spine: Normal marrow signal is preserved. Sinuses/Orbits: Paranasal sinuses are aerated. Orbits are unremarkable. Other: Sella is unremarkable.  Mastoid air cells are clear. IMPRESSION: No acute infarction, hemorrhage, or mass. Mild chronic microvascular ischemic changes. Electronically Signed   By: Macy Mis M.D.   On: 06/16/2021 17:23   ECHOCARDIOGRAM COMPLETE BUBBLE STUDY  Result Date: 06/17/2021    ECHOCARDIOGRAM REPORT   Patient Name:   Steve Sampson Date of Exam: 06/17/2021 Medical Rec #:  [id number]    Height:       72.0 in Accession #:    [id number]   Weight:       252.0 lb Date of Birth:  [birth year]   BSA:          2.350 m Patient Age:    12 years     BP:           165/84 mmHg Patient Gender: M            HR:           72 bpm. Exam Location:  [facility] Procedure: 2D Echo, Color Doppler, Cardiac Doppler and Saline Contrast Bubble            Study Indications:     I63.9 Stroke  History:         Patient has no prior history of Echocardiogram examinations.                  Risk Factors:Hypertension.   Sonographer:     Charmayne Sheer Referring Phys:  [id number] Gretta Cool PATEL Diagnosing Phys: Nelva Bush MD  Sonographer Comments: Suboptimal subcostal window. IMPRESSIONS  1. Left ventricular ejection fraction, by estimation, is 50 to 55%. The left ventricle has low  normal function. The left ventricle has no regional wall motion abnormalities. There is mild left ventricular hypertrophy. Left ventricular diastolic parameters are consistent with Grade I diastolic dysfunction (impaired relaxation).  2. Right ventricular systolic function is normal. The right ventricular size is normal. Tricuspid regurgitation signal is inadequate for assessing PA pressure.  3. Left atrial size was mildly dilated.  4. Right atrial size was mildly dilated.  5. The mitral valve is degenerative. Trivial mitral valve regurgitation. No evidence of mitral stenosis.  6. The aortic valve has an indeterminant number of cusps. Aortic valve regurgitation is not visualized. No aortic stenosis is present.  7. Aortic dilatation noted. There is borderline dilatation of the aortic root, measuring 39 mm.  8. The inferior vena cava is dilated in size with <50% respiratory variability, suggesting right atrial pressure of 15 mmHg.  9. Bubble study is non-diagnostic for intracardiac shunting. FINDINGS  Left Ventricle: Left ventricular ejection fraction, by estimation, is 50 to 55%. The left ventricle has low normal function. The left ventricle has no regional wall motion abnormalities. The left ventricular internal cavity size was normal in size. There is mild left ventricular hypertrophy. Left ventricular diastolic parameters are consistent with Grade I diastolic dysfunction (impaired relaxation). Right Ventricle: The right ventricular size is normal. No increase in right ventricular wall thickness. Right ventricular systolic function is normal. Tricuspid regurgitation signal is inadequate for assessing PA pressure. Left Atrium: Left atrial size was mildly  dilated. Right Atrium: Right atrial size was mildly dilated. Pericardium: There is no evidence of pericardial effusion. Mitral Valve: The mitral valve is degenerative in appearance. There is mild thickening of the mitral valve leaflet(s). Mild mitral annular calcification. Trivial mitral valve regurgitation. No evidence of mitral valve stenosis. MV peak gradient, 4.8 mmHg. The mean mitral valve gradient is 2.0 mmHg. Tricuspid Valve: The tricuspid valve is grossly normal. Tricuspid valve regurgitation is not demonstrated. Aortic Valve: The aortic valve has an indeterminant number of cusps. Aortic valve regurgitation is not visualized. No aortic stenosis is present. Aortic valve mean gradient measures 3.0 mmHg. Aortic valve peak gradient measures 5.3 mmHg. Aortic valve area, by VTI measures 2.60 cm. Pulmonic Valve: The pulmonic valve was not well visualized. Pulmonic valve regurgitation is not visualized. No evidence of pulmonic stenosis. Aorta: Aortic dilatation noted. There is borderline dilatation of the aortic root, measuring 39 mm. Pulmonary Artery: The pulmonary artery is of normal size. Venous: The inferior vena cava is dilated in size with less than 50% respiratory variability, suggesting right atrial pressure of 15 mmHg. IAS/Shunts: The interatrial septum was not well visualized. Agitated saline contrast was given intravenously to evaluate for intracardiac shunting. Bubble study is non-diagnostic for intracardiac shunting.  LEFT VENTRICLE PLAX 2D LVIDd:         4.22 cm   Diastology LVIDs:         2.93 cm   LV e' medial:    6.42 cm/s LV PW:         1.21 cm   LV E/e' medial:  11.6 LV IVS:        1.11 cm   LV e' lateral:   7.29 cm/s LVOT diam:     2.10 cm   LV E/e' lateral: 10.2 LV SV:         53 LV SV Index:   23 LVOT Area:     3.46 cm  RIGHT VENTRICLE RV Basal diam:  3.77 cm LEFT ATRIUM  Index        RIGHT ATRIUM           Index LA diam:        4.20 cm 1.79 cm/m   RA Area:     18.40 cm LA Vol  (A2C):   73.0 ml 31.06 ml/m  RA Volume:   49.50 ml  21.06 ml/m LA Vol (A4C):   64.1 ml 27.27 ml/m LA Biplane Vol: 73.9 ml 31.44 ml/m  AORTIC VALVE                    PULMONIC VALVE AV Area (Vmax):    2.18 cm     PV Vmax:       0.87 m/s AV Area (Vmean):   2.44 cm     PV Vmean:      63.400 cm/s AV Area (VTI):     2.60 cm     PV VTI:        0.160 m AV Vmax:           115.00 cm/s  PV Peak grad:  3.0 mmHg AV Vmean:          74.100 cm/s  PV Mean grad:  2.0 mmHg AV VTI:            0.205 m AV Peak Grad:      5.3 mmHg AV Mean Grad:      3.0 mmHg LVOT Vmax:         72.50 cm/s LVOT Vmean:        52.100 cm/s LVOT VTI:          0.154 m LVOT/AV VTI ratio: 0.75  AORTA Ao Root diam: 3.90 cm MITRAL VALVE MV Area (PHT): 4.06 cm    SHUNTS MV Area VTI:   2.38 cm    Systemic VTI:  0.15 m MV Peak grad:  4.8 mmHg    Systemic Diam: 2.10 cm MV Mean grad:  2.0 mmHg MV Vmax:       1.10 m/s MV Vmean:      60.9 cm/s MV Decel Time: 187 msec MV E velocity: 74.60 cm/s MV A velocity: 75.40 cm/s MV E/A ratio:  0.99 Harrell Gave End MD Electronically signed by Nelva Bush MD Signature Date/Time: 06/17/2021/1:38:06 PM    Final      Subjective: Seen and examined and he felt back to his baseline.  No nausea or vomiting.  Denies any lightheadedness or dizziness.  Wanting to go home.  Denies any other concerns or close this time  Discharge Exam: Vitals:   06/17/21 1127 06/17/21 1554  BP: (!) 160/90 (!) 179/91  Pulse: 66 65  Resp:  17  Temp:  98 F (36.7 C)  SpO2:  97%   Vitals:   06/17/21 0743 06/17/21 1125 06/17/21 1127 06/17/21 1554  BP: (!) 165/84 (!) 174/79 (!) 160/90 (!) 179/91  Pulse: 76 73 66 65  Resp:  16  17  Temp:  (!) 97.5 F (36.4 C)  98 F (36.7 C)  TempSrc:  Oral  Oral  SpO2:  98%  97%  Weight:      Height:       General: Pt is alert, awake, not in acute distress Cardiovascular: RRR, S1/S2 +, no rubs, no gallops Respiratory: Slightly diminished bilaterally, no wheezing, no rhonchi; unlabored  breathing Abdominal: Soft, NT, distended secondary body habitus, bowel sounds + Extremities: no edema, no cyanosis  The results of significant diagnostics from this hospitalization (including imaging, microbiology, ancillary  and laboratory) are listed below for reference.    Microbiology: Recent Results (from the past 240 hour(s))  Resp Panel by RT-PCR (Flu A&B, Covid) Nasopharyngeal Swab     Status: None   Collection Time: 06/16/21  6:55 PM   Specimen: Nasopharyngeal Swab; Nasopharyngeal(NP) swabs in vial transport medium  Result Value Ref Range Status   SARS Coronavirus 2 by RT PCR NEGATIVE NEGATIVE Final    Comment: (NOTE) SARS-CoV-2 target nucleic acids are NOT DETECTED.  The SARS-CoV-2 RNA is generally detectable in upper respiratory specimens during the acute phase of infection. The lowest concentration of SARS-CoV-2 viral copies this assay can detect is 138 copies/mL. A negative result does not preclude SARS-Cov-2 infection and should not be used as the sole basis for treatment or other patient management decisions. A negative result may occur with  improper specimen collection/handling, submission of specimen other than nasopharyngeal swab, presence of viral mutation(s) within the areas targeted by this assay, and inadequate number of viral copies(<138 copies/mL). A negative result must be combined with clinical observations, patient history, and epidemiological information. The expected result is Negative.  Fact Sheet for Patients:  EntrepreneurPulse.com.au  Fact Sheet for Healthcare Providers:  IncredibleEmployment.be  This test is no t yet approved or cleared by the [place] FDA and  has been authorized for detection and/or diagnosis of SARS-CoV-2 by FDA under an Emergency Use Authorization (EUA). This EUA will remain  in effect (meaning this test can be used) for the duration of the COVID-19 declaration under Section 564(b)(1)  of the Act, 21 U.S.C.section 360bbb-3(b)(1), unless the authorization is terminated  or revoked sooner.       Influenza A by PCR NEGATIVE NEGATIVE Final   Influenza B by PCR NEGATIVE NEGATIVE Final    Comment: (NOTE) The Xpert Xpress SARS-CoV-2/FLU/RSV plus assay is intended as an aid in the diagnosis of influenza from Nasopharyngeal swab specimens and should not be used as a sole basis for treatment. Nasal washings and aspirates are unacceptable for Xpert Xpress SARS-CoV-2/FLU/RSV testing.  Fact Sheet for Patients: EntrepreneurPulse.com.au  Fact Sheet for Healthcare Providers: IncredibleEmployment.be  This test is not yet approved or cleared by the [place] FDA and has been authorized for detection and/or diagnosis of SARS-CoV-2 by FDA under an Emergency Use Authorization (EUA). This EUA will remain in effect (meaning this test can be used) for the duration of the COVID-19 declaration under Section 564(b)(1) of the Act, 21 U.S.C. section 360bbb-3(b)(1), unless the authorization is terminated or revoked.  Performed at [facility], [address] [place], [place] [address]     Labs: BNP (last 3 results) No results for input(s): BNP in the last 8760 hours. Basic Metabolic Panel: Recent Labs  Lab 06/16/21 1431 06/17/21 0800  NA 139 139  K 3.9 3.9  CL 104 104  CO2 25 25  GLUCOSE 152* 113*  BUN 21 18  CREATININE 1.06 1.05  CALCIUM 9.6 9.2  MG  --  1.9  PHOS  --  3.7   Liver Function Tests: Recent Labs  Lab 06/17/21 0800  AST 24  ALT 29  ALKPHOS 43  BILITOT 0.8  PROT 7.4  ALBUMIN 4.4   No results for input(s): LIPASE, AMYLASE in the last 168 hours. No results for input(s): AMMONIA in the last 168 hours. CBC: Recent Labs  Lab 06/16/21 1431 06/17/21 0800  WBC 5.7 5.7  NEUTROABS  --  3.2  HGB 14.9 14.9  HCT 45.1 45.1  MCV 83.1 81.4  PLT 189 175   Cardiac Enzymes: No results for input(s): CKTOTAL,  CKMB, CKMBINDEX, TROPONINI in the last 168 hours. BNP: Invalid input(s): POCBNP CBG: No results for input(s): GLUCAP in the last 168 hours. D-Dimer No results for input(s): DDIMER in the last 72 hours. Hgb A1c No results for input(s): HGBA1C in the last 72 hours. Lipid Profile Recent Labs    06/17/21 0444  CHOL 138  HDL 27*  LDLCALC 82  TRIG 147  CHOLHDL 5.1   Thyroid function studies No results for input(s): TSH, T4TOTAL, T3FREE, THYROIDAB in the last 72 hours.  Invalid input(s): FREET3 Anemia work up No results for input(s): VITAMINB12, FOLATE, FERRITIN, TIBC, IRON, RETICCTPCT in the last 72 hours. Urinalysis    Component Value Date/Time   COLORURINE YELLOW 06/17/2021 0845   APPEARANCEUR CLEAR 06/17/2021 0845   APPEARANCEUR Clear 03/25/2021 1322   LABSPEC 1.025 06/17/2021 0845   PHURINE 6.0 06/17/2021 0845   GLUCOSEU NEGATIVE 06/17/2021 0845   HGBUR NEGATIVE 06/17/2021 0845   BILIRUBINUR NEGATIVE 06/17/2021 0845   BILIRUBINUR Negative 03/25/2021 1322   KETONESUR NEGATIVE 06/17/2021 0845   PROTEINUR NEGATIVE 06/17/2021 0845   NITRITE NEGATIVE 06/17/2021 0845   LEUKOCYTESUR NEGATIVE 06/17/2021 0845   Sepsis Labs Invalid input(s): PROCALCITONIN,  WBC,  LACTICIDVEN Microbiology Recent Results (from the past 240 hour(s))  Resp Panel by RT-PCR (Flu A&B, Covid) Nasopharyngeal Swab     Status: None   Collection Time: 06/16/21  6:55 PM   Specimen: Nasopharyngeal Swab; Nasopharyngeal(NP) swabs in vial transport medium  Result Value Ref Range Status   SARS Coronavirus 2 by RT PCR NEGATIVE NEGATIVE Final    Comment: (NOTE) SARS-CoV-2 target nucleic acids are NOT DETECTED.  The SARS-CoV-2 RNA is generally detectable in upper respiratory specimens during the acute phase of infection. The lowest concentration of SARS-CoV-2 viral copies this assay can detect is 138 copies/mL. A negative result does not preclude SARS-Cov-2 infection and should not be used as the sole basis  for treatment or other patient management decisions. A negative result may occur with  improper specimen collection/handling, submission of specimen other than nasopharyngeal swab, presence of viral mutation(s) within the areas targeted by this assay, and inadequate number of viral copies(<138 copies/mL). A negative result must be combined with clinical observations, patient history, and epidemiological information. The expected result is Negative.  Fact Sheet for Patients:  EntrepreneurPulse.com.au  Fact Sheet for Healthcare Providers:  IncredibleEmployment.be  This test is no t yet approved or cleared by the [place] FDA and  has been authorized for detection and/or diagnosis of SARS-CoV-2 by FDA under an Emergency Use Authorization (EUA). This EUA will remain  in effect (meaning this test can be used) for the duration of the COVID-19 declaration under Section 564(b)(1) of the Act, 21 U.S.C.section 360bbb-3(b)(1), unless the authorization is terminated  or revoked sooner.       Influenza A by PCR NEGATIVE NEGATIVE Final   Influenza B by PCR NEGATIVE NEGATIVE Final    Comment: (NOTE) The Xpert Xpress SARS-CoV-2/FLU/RSV plus assay is intended as an aid in the diagnosis of influenza from Nasopharyngeal swab specimens and should not be used as a sole basis for treatment. Nasal washings and aspirates are unacceptable for Xpert Xpress SARS-CoV-2/FLU/RSV testing.  Fact Sheet for Patients: EntrepreneurPulse.com.au  Fact Sheet for Healthcare Providers: IncredibleEmployment.be  This test is not yet approved or cleared by the [place] FDA and has been authorized for detection and/or diagnosis of SARS-CoV-2 by FDA under an Emergency Use  Authorization (EUA). This EUA will remain in effect (meaning this test can be used) for the duration of the COVID-19 declaration under Section 564(b)(1) of the Act, 21  U.S.C. section 360bbb-3(b)(1), unless the authorization is terminated or revoked.  Performed at [facility], [address] [place], [place] [address]    Time coordinating discharge: 35 minutes  SIGNED:  Kerney Elbe, DO [facility] 06/17/2021, 6:48 PM Pager is on [place]  If 7PM-7AM, please contact night-coverage [email or web address]

## 2021-06-17 NOTE — Care Management (Signed)
°  Transition of Care [facility]) Screening Note   Patient Details  Name: Steve Sampson Date of Birth: 1951/03/14   Transition [facility]) CM/SW Contact:    Pete Pelt, RN Phone Number: 06/17/2021, 3:51 PM    Transition of Care Department [facility]) has reviewed patient and no TOC needs have been identified at this time. We will continue to monitor patient advancement through interdisciplinary progression rounds. If new patient transition needs arise, please place a TOC consult.

## 2021-06-17 NOTE — Care Management CC44 (Signed)
Condition Code 44 Documentation Completed  Patient Details  Name: Steve Sampson MRN: [id number] Date of Birth: February 15, 1951   Condition Code 44 given:  yes  Patient signature on Condition Code 44 notice:  yes  Documentation of 2 MD's agreement:  yes Code 44 added to claim:   yes    Pete Pelt, RN 06/17/2021, 3:54 PM

## 2021-06-17 NOTE — Progress Notes (Signed)
SLP Cancellation Note  Patient Details Name: Keymari Sato MRN: [id number] DOB: 1950/12/20   Cancelled treatment:       Reason Eval/Treat Not Completed: SLP screened, no needs identified, will sign off  Konya Fauble B. Rutherford Nail M.S., CCC-SLP, [place] Office [phone]  Lyrick Worland Rutherford Nail 06/17/2021, 10:18 AM

## 2021-06-17 NOTE — Progress Notes (Signed)
PT Cancellation Note  Patient Details Name: Steve Sampson MRN: [id number] DOB: [birth year]   Cancelled Treatment:    Reason Eval/Treat Not Completed: PT screened, no needs identified, will sign off. Order received, pt chart reviewed. Pt sitting in recliner fully dressed, reporting he is back to his baseline. PT screened strength, sensation and coordination - all are symmetric, no deficits. STS and ambulation within room performed safely; no balance deficits. PT signing off; if pt status changes please re-consult.     Patrina Levering PT, DPT 06/17/21 9:27 AM [phone]

## 2021-06-17 NOTE — Consult Note (Addendum)
NEURO HOSPITALIST CONSULT NOTE   Requesting physician: Dr. Alfredia Ferguson  Reason for Consult: Headache with transient neurological symptoms  History obtained from:  Patient and Chart     HPI:                                                                                                                                          Steve Sampson is an 71 y.o. male with a PMHx of prostate cancer several decades ago, gout, HTN, bilateral hearing loss, erectile dysfunction and nocturia who presented to the ED yesterday afternoon after he had onset of visual symptom of "floaters" and a "checkerboard" pattern in the visual fields of both of his eyes. He did note that he gets these when he has a migraine coming on. He endorsed having some mild pain in the front of his head, which was typical for one of his migraines. He took ibuprofen and the floaters resolved. However, when he next spoke, he suddenly felt as though he could not get the words out. He knew what he wanted to say but could not form the words to speak. He also felt weak in his hands and felt as though he was not able to hold tightly onto objects, with both hands feeling equally weak. The symptoms lasted for between 30-60 minutes prior to resolving spontaneously. The symptoms were worrisome to the patient as his father had a history of TIAs. The patient had never had hand weakness or speech changes with one of his migraines. He was advised by his primary care doctor to be seen in the ED.  At the time of his initial ED evaluation, his symptoms had resolved completely.   Initial SBP was 204. BP in the ED was also elevated, but had lowered somewhat to 178-179/84-98. CT head was unremarkable. MRI brain showed chronic small vessel ischemic changes without acute abnormality. Glucose was 150.   He was loaded with ASA 650 mg in the ED and admitted for TIA workup and management of his HTN.    Past Medical History:  Diagnosis Date   Arthritis     Bilateral hearing loss    ED (erectile dysfunction)    Gout    Hypertension    Nocturia    Prostate cancer ([place])     Past Surgical History:  Procedure Laterality Date   CHOLECYSTECTOMY     KNEE SURGERY Right    seed implantation prostate     TONSILECTOMY, ADENOIDECTOMY, BILATERAL MYRINGOTOMY AND TUBES      Family History  Problem Relation Age of Onset   Liver cancer Mother    Prostate cancer Paternal Grandfather    Colon cancer Other    Esophageal cancer Other        TIAs in father  Social History:  reports that he has never smoked. He has never used smokeless tobacco. He reports that he does not drink alcohol and does not use drugs.  Allergies  Allergen Reactions   Red Maple (Acer Rubrum) Allergy Skin Test     Other reaction(s): Other (See Comments) Flu like symptoms    Home Medications: No current facility-administered medications on file prior to encounter.   Current Outpatient Medications on File Prior to Encounter  Medication Sig Dispense Refill   busPIRone (BUSPAR) 10 MG tablet Take 10 mg by mouth 2 (two) times daily.     busPIRone (BUSPAR) 10 MG tablet Take 10 mg by mouth 2 (two) times daily.     febuxostat (ULORIC) 40 MG tablet febuxostat 40 mg tablet  TAKE 1 TABLET (40 MG TOTAL) BY MOUTH ONCE DAILY FOR 30 DAYS     Ibuprofen 200 MG CAPS ibuprofen 200 mg capsule     ketoconazole (NIZORAL) 2 % cream Apply to the face and feet QHS. 60 g 3   ketoconazole (NIZORAL) 2 % shampoo Shampoo from the neck up let sit a few minutes then wash off. Use 3d/wk. 120 mL 3   losartan (COZAAR) 50 MG tablet Take 1 tablet by mouth daily.       MEDICATIONS:                                                                                                                     Scheduled:  aspirin  325 mg Oral Daily   atorvastatin  40 mg Oral Daily   febuxostat  40 mg Oral Daily   heparin  5,000 Units Subcutaneous Q8H   Continuous:  sodium chloride Stopped (06/16/21 2154)      ROS:                                                                                                                                       No fevers, chills, recent injury, N/V, cough or rash. Other ROS as per HPI.   Blood pressure (!) 165/84, pulse 76, temperature 98.5 F (36.9 C), temperature source Oral, resp. rate 16, height 6' (1.829 m), weight 114.3 kg, SpO2 95 %.   General Examination:  Physical Exam  HEENT-  [place]/AT   Lungs- Respirations unlabored Extremities- No edema  Neurological Examination Mental Status: Alert, fully oriented, thought content appropriate.  Speech fluent and nondysarthric with intact naming of objects requiring intact recollection of common and uncommonly used words. No paraphasias noted. No speech delay or stuttering noted. Verbal comprehension and repetition intact. No dyscalculia. Mildly anxious affect.   Cranial Nerves: II: PERRL Temporal visual fields intact with no extinction to DSS.   III,IV, VI: No ptosis. EOMI without nystagmus.  V: Temp sensation equal bilaterally VII: Smile symmetric VIII: Hearing intact to voice IX,X: No hypophonia or hoarseness XI: Symmetric XII: Midline tongue extension Motor: Right : Upper extremity   5/5    Left:     Upper extremity   5/5  Lower extremity   5/5     Lower extremity   5/5 No pronator drift.  Tone and bulk are normal.  Sensory: Temp and light touch intact in all 4 extremities without asymmetry. No extinction to DSS.  Deep Tendon Reflexes: 2+ and symmetric throughout Plantars: Right: downgoing   Left: downgoing Cerebellar: No ataxia with FNF and H-S bilaterally  Gait: Deferred   Lab Results: Basic Metabolic Panel: Recent Labs  Lab 06/16/21 1431  NA 139  K 3.9  CL 104  CO2 25  GLUCOSE 152*  BUN 21  CREATININE 1.06  CALCIUM 9.6    CBC: Recent Labs  Lab 06/16/21 1431  WBC 5.7  HGB 14.9   HCT 45.1  MCV 83.1  PLT 189    Cardiac Enzymes: No results for input(s): CKTOTAL, CKMB, CKMBINDEX, TROPONINI in the last 168 hours.  Lipid Panel: Recent Labs  Lab 06/17/21 0444  CHOL 138  TRIG 147  HDL 27*  CHOLHDL 5.1  VLDL 29  LDLCALC 82    Imaging: CT HEAD WO CONTRAST  Result Date: 06/16/2021 CLINICAL DATA:  Dizziness, nonspecific. Blurred vision and hypertension. Slurred speech. EXAM: CT HEAD WITHOUT CONTRAST TECHNIQUE: Contiguous axial images were obtained from the base of the skull through the vertex without intravenous contrast. RADIATION DOSE REDUCTION: This exam was performed according to the departmental dose-optimization program which includes automated exposure control, adjustment of the mA and/or kV according to patient size and/or use of iterative reconstruction technique. COMPARISON:  None. FINDINGS: Brain: No focal abnormality seen affecting the brainstem or cerebellum. Cerebral hemispheres show mild age related volume loss without evidence of old or acute focal infarction, mass lesion, hemorrhage, hydrocephalus or extra-axial collection. Vascular: There is atherosclerotic calcification of the major vessels at the base of the brain. Skull: Negative Sinuses/Orbits: Clear/normal Other: None IMPRESSION: No focal or acute brain finding. Mild age related volume loss. Atherosclerotic calcification of the major vessels at the base of the brain, usually seen at this age. Electronically Signed   By: Nelson Chimes M.D.   On: 06/16/2021 15:07   MR ANGIO NECK W WO CONTRAST  Result Date: 06/16/2021 CLINICAL DATA:  TIA EXAM: MRA NECK WITHOUT AND WITH CONTRAST TECHNIQUE: Multiplanar and multiecho pulse sequences of the neck were obtained without and with intravenous contrast. Angiographic images of the neck were obtained using MRA technique without and with intravenous contrast. CONTRAST:  39mL GADAVIST GADOBUTROL 1 MMOL/ML IV SOLN COMPARISON:  None. FINDINGS: Normal aortic branching. No  evidence of dissection or significant stenosis of the origins of the branch vessels. Common, internal, and external carotid arteries are patent, without hemodynamically significant stenosis. Extracranial vertebral arteries are patent, without hemodynamically significant stenosis. The left vertebral artery is  diminutive from its origin to the terminus. Right dominant system. IMPRESSION: No hemodynamically significant stenosis in the neck. Electronically Signed   By: Merilyn Baba M.D.   On: 06/16/2021 22:38   MR BRAIN WO CONTRAST  Result Date: 06/16/2021 CLINICAL DATA:  Neuro deficit, acute, stroke suspected EXAM: MRI HEAD WITHOUT CONTRAST TECHNIQUE: Multiplanar, multiecho pulse sequences of the brain and surrounding structures were obtained without intravenous contrast. COMPARISON:  None. FINDINGS: Brain: There is no acute infarction or intracranial hemorrhage. There is no intracranial mass, mass effect, or edema. There is no hydrocephalus or extra-axial fluid collection. Ventricles and sulci are normal in size and configuration. Patchy foci of T2 hyperintensity in the supratentorial and pontine white matter are nonspecific but may reflect mild chronic microvascular ischemic changes. A punctate focus of susceptibility in the right frontal subcortical white matter likely reflects chronic microhemorrhage. Vascular: Major vessel flow voids at the skull base are preserved. Skull and upper cervical spine: Normal marrow signal is preserved. Sinuses/Orbits: Paranasal sinuses are aerated. Orbits are unremarkable. Other: Sella is unremarkable.  Mastoid air cells are clear. IMPRESSION: No acute infarction, hemorrhage, or mass. Mild chronic microvascular ischemic changes. Electronically Signed   By: Macy Mis M.D.   On: 06/16/2021 17:23    TTE: 1. Left ventricular ejection fraction, by estimation, is 50 to 55%. The  left ventricle has low normal function. The left ventricle has no regional  wall motion  abnormalities. There is mild left ventricular hypertrophy.  Left ventricular diastolic  parameters are consistent with Grade I diastolic dysfunction (impaired  relaxation).   2. Right ventricular systolic function is normal. The right ventricular  size is normal. Tricuspid regurgitation signal is inadequate for assessing  PA pressure.   3. Left atrial size was mildly dilated.   4. Right atrial size was mildly dilated.   5. The mitral valve is degenerative. Trivial mitral valve regurgitation.  No evidence of mitral stenosis.   6. The aortic valve has an indeterminant number of cusps. Aortic valve  regurgitation is not visualized. No aortic stenosis is present.   7. Aortic dilatation noted. There is borderline dilatation of the aortic  root, measuring 39 mm.   8. The inferior vena cava is dilated in size with <50% respiratory  variability, suggesting right atrial pressure of 15 mmHg.   9. Bubble study is non-diagnostic for intracardiac shunting.   Assessment: 71 year old male presenting with transient neurological symptoms associated with migrainous headache 1. Neurological exam is normal.  2. CT head: No focal or acute brain finding. Mild age related volume loss. Atherosclerotic calcification of the major vessels at the base of the brain, usually seen at this age 60. MRI brain: No acute infarction, hemorrhage, or mass. Mild chronic microvascular ischemic changes. 4. MRA neck: No hemodynamically significant stenosis in the neck.  5. TTE: No mural thrombus or valvular vegetation mentioned in the report. Bubble study nondiagnostic for intracardiac shunting.  6. EKG: Normal sinus rhythm, Incomplete right bundle branch block, Nonspecific ST abnormality 7. DDx for presentation includes hypertensive urgency and TIA. Less likely would be an atypical presentation of complicated migraine. .   Recommendations: 1. Continue ASA 81 mg po qd, which has been started this admission.  2. Continue  atorvastatin, which has been started this admission.  3. MRA head (ordered) 4. Risk factor modification. Recommending light daily exercise to include 30 minutes of relaxed, low-exertion walking per day.  5. BP management with goal normotensive. Symptoms more likely to have been secondary  to HTN or complicated migraine than TIA.  6. HgbA1c, fasting lipid panel 7. PT consult, OT consult, Speech consult 8. Wife states that he has been under a lot of stress recently. Recommend stress reduction techniques.  9. Outpatient Neurology follow up.   Electronically signed: Dr. Kerney Elbe 06/17/2021, 8:03 AM

## 2021-06-17 NOTE — Progress Notes (Signed)
OT Cancellation Note  Patient Details Name: Steve Sampson MRN: [id number] DOB: [birth year]   Cancelled Treatment:    Reason Eval/Treat Not Completed: OT screened, no needs identified, will sign off. Order received, chart reviewed. Currently pt reporting symptoms have resolved and reports baseline independence to perform ADL and mobility tasks. Per discussion with PT, no strength, sensory, or coordination deficits appreciated with assessment. Pt does endorse baseline trigger finger of third digit of RUE; pt educated on role of outpatient OT to address this deficit, but pt reported that trigger finger does not impact occupational performance. No skilled OT needs identified. Will sign off. Please re-consult if additional OT needs arise.   Fredirick Maudlin, OTR/[facility] [place]

## 2021-06-17 NOTE — Progress Notes (Signed)
*  PRELIMINARY RESULTS* Echocardiogram 2D Echocardiogram has been performed.  Steve Sampson 06/17/2021, 10:55 AM

## 2021-06-17 NOTE — Progress Notes (Addendum)
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  There is also some complaints of some blurred vision but patient denies this.  He is also reporting weakness which is now resolved.  He had COVID in August of last year and has improved.  He presented with slurred speech with a concern for TIA versus CVA and MRI was done and showed no evidence of CVA.  He is currently undergoing a TIA work-up and neurology has been consulted and recommending an MRA of the head which is still pending to be done.  Echocardiogram has been done and SLP and PT OT recommending no follow-up.  Patient feels that he is back to baseline.  Neurology feels his symptoms could have been related to uncontrolled hypertension or a complicated migraine rather than a TIA but still wanted to obtain an MRA of the brain.  Neurology recommended continuing aspirin 81 mg p.o. daily as well as atorvastatin and risk factor modification.  Assessment & Plan:   Principal Problem:   Slurred speech Active Problems:   HTN, goal below 140/90   Chronic gouty arthritis  Transient neurological symptoms with slurred speech and left hand weakness -Improved and back to baseline -Concern for TIA versus CVA but MRI of the brain showed no acute infarction, hemorrhage or mass -CT head done and showed no focal acute brain findings but did show mild age related volume loss and atherosclerotic calcification of the vessels at the base of the brain he was seen at this age -MRI of the brain as above and did show chronic microvascular  ischemic changes -MRI of the neck showed no hemodynamically significant stenosis in the neck -Transthoracic echocardiogram done and showed no mural thrombus or valvular vegetation mentioned report and the bubble study was nondiagnostic for intracardiac shunting and an EF of 50 to 55% and grade 1 diastolic dysfunction -Neurology feels that this could be related to his hypertensive urgency and less likely a a complicated migraine; neurology feels that the patient could have a TIA as well but recommends continue aspirin 81 mg p.o. daily and atorvastatin -Neurology recommends obtaining MRI of the head which is still pending be done as well as risk factor modification -Lipid panel done and hemoglobin A1c still pending -Lipid panel showed a total cholesterol/HDL ratio 5.1, cholesterol level 130, HDL 27, LDL of 82, triglycerides 147, VLDL 29 -Received aspirin 324 mg daily and then started on 325 mg daily as well as atorvastatin 40 mg p.o. daily -PT OT and SLP consults done and recommend no follow-up -Neurologist to follow-up with the patient outpatient setting  Uncontrolled hypertension -Initially had permissive hypertension but now will slowly add back in medication -Continue monitor blood pressures per protocol -We will resume his hydrochlorothiazide, lisinopril and Benicar as well as continuing hydralazine -Last blood pressure reading was 179/91 -We will getting IV fluid hydration with 50 MLS per hour which will now stop  Hyperglycemia in the setting of PreDiabetes -Checking hemoglobin A1c  was pending but resulted at 6.0; blood sugar on admission was 152 and repeat this morning was 113 -Continue monitor  CBGs carefully and follow-up in outpatient setting with PCP for further blood sugar management and evaluation  Gout -Continue with Febuxostat 40 mg p.o. daily  Obesity -Complicates overall prognosis and care -Estimated body mass index is 34.18 kg/m as calculated from the following:   Height  as of this encounter: 6' (1.829 m).   Weight as of this encounter: 114.3 kg.  -Weight Loss and Dietary Counseling given   DVT prophylaxis: Heparin 5000 units subcu every 8 Code Status: FULL CODE Family Communication: No family currently at bedside Disposition Plan: Anticipating discharging home in next 24 hours once his MRI is done and if it is negative and neurology signed off the case  Status is: Observation  The patient remains OBS appropriate and will d/c before 2 midnights.  Consultants:  Neurology  Procedures:  ECHO MRI MRA NECK MRA HEAD (pending)  Antimicrobials:  Anti-infectives (From admission, onward)    None        Subjective: Seen and examined and he felt back to his baseline.  No nausea or vomiting.  Denies any lightheadedness or dizziness.  Wanting to go home.  Denies any other concerns or close this time  Objective: Vitals:   06/17/21 0743 06/17/21 1125 06/17/21 1127 06/17/21 1554  BP: (!) 165/84 (!) 174/79 (!) 160/90 (!) 179/91  Pulse: 76 73 66 65  Resp:  16  17  Temp:  (!) 97.5 F (36.4 C)  98 F (36.7 C)  TempSrc:  Oral  Oral  SpO2:  98%  97%  Weight:      Height:        Intake/Output Summary (Last 24 hours) at 06/17/2021 1806 Last data filed at 06/17/2021 1101 Gross per 24 hour  Intake 258.84 ml  Output --  Net 258.84 ml   Filed Weights   06/16/21 1430  Weight: 114.3 kg   Examination: Physical Exam:  Constitutional: WN/WD obese Caucasian male currently no acute distress appears calm getting his Eyes: Lids and conjunctivae normal, sclerae anicteric  ENMT: External Ears, Nose appear normal. Grossly normal hearing. Mucous membranes are moist.  Neck: Appears normal, supple, no cervical masses, normal ROM, no appreciable thyromegaly; no appreciable JVD Respiratory: Diminished to auscultation bilaterally, no wheezing, rales, rhonchi or crackles. Normal respiratory effort and patient is not tachypenic. No accessory muscle use.  Unlabored  breathing Cardiovascular: RRR, no murmurs / rubs / gallops. S1 and S2 auscultated.  Trace extremity edema.  Abdomen: Soft, non-tender, distended secondary body. Bowel sounds positive.  GU: Deferred. Musculoskeletal: No clubbing / cyanosis of digits/nails. No joint deformity upper and lower extremities. Skin: No rashes, lesions, ulcers on limited skin evaluation. No induration; Warm and dry.  Neurologic: CN 2-12 grossly intact with no focal deficits. Romberg sign and cerebellar reflexes not assessed.  Psychiatric: Normal judgment and insight. Alert and oriented x 3. Normal mood and appropriate affect.   Data Reviewed: I have personally reviewed following labs and imaging studies  CBC: Recent Labs  Lab 06/16/21 1431 06/17/21 0800  WBC 5.7 5.7  NEUTROABS  --  3.2  HGB 14.9 14.9  HCT 45.1 45.1  MCV 83.1 81.4  PLT 189 846   Basic Metabolic Panel: Recent Labs  Lab 06/16/21 1431 06/17/21 0800  NA 139 139  K 3.9 3.9  CL 104 104  CO2 25 25  GLUCOSE 152* 113*  BUN 21 18  CREATININE 1.06 1.05  CALCIUM 9.6 9.2  MG  --  1.9  PHOS  --  3.7   GFR: Estimated  Creatinine Clearance: 86.7 mL/min (by C-G formula based on SCr of 1.05 mg/dL). Liver Function Tests: Recent Labs  Lab 06/17/21 0800  AST 24  ALT 29  ALKPHOS 43  BILITOT 0.8  PROT 7.4  ALBUMIN 4.4   No results for input(s): LIPASE, AMYLASE in the last 168 hours. No results for input(s): AMMONIA in the last 168 hours. Coagulation Profile: Recent Labs  Lab 06/16/21 1905  INR 1.0   Cardiac Enzymes: No results for input(s): CKTOTAL, CKMB, CKMBINDEX, TROPONINI in the last 168 hours. BNP (last 3 results) No results for input(s): PROBNP in the last 8760 hours. HbA1C: No results for input(s): HGBA1C in the last 72 hours. CBG: No results for input(s): GLUCAP in the last 168 hours. Lipid Profile: Recent Labs    06/17/21 0444  CHOL 138  HDL 27*  LDLCALC 82  TRIG 147  CHOLHDL 5.1   Thyroid Function Tests: Recent  Labs    06/16/21 2036  FREET4 0.77   Anemia Panel: No results for input(s): VITAMINB12, FOLATE, FERRITIN, TIBC, IRON, RETICCTPCT in the last 72 hours. Sepsis Labs: No results for input(s): PROCALCITON, LATICACIDVEN in the last 168 hours.  Recent Results (from the past 240 hour(s))  Resp Panel by RT-PCR (Flu A&B, Covid) Nasopharyngeal Swab     Status: None   Collection Time: 06/16/21  6:55 PM   Specimen: Nasopharyngeal Swab; Nasopharyngeal(NP) swabs in vial transport medium  Result Value Ref Range Status   SARS Coronavirus 2 by RT PCR NEGATIVE NEGATIVE Final    Comment: (NOTE) SARS-CoV-2 target nucleic acids are NOT DETECTED.  The SARS-CoV-2 RNA is generally detectable in upper respiratory specimens during the acute phase of infection. The lowest concentration of SARS-CoV-2 viral copies this assay can detect is 138 copies/mL. A negative result does not preclude SARS-Cov-2 infection and should not be used as the sole basis for treatment or other patient management decisions. A negative result may occur with  improper specimen collection/handling, submission of specimen other than nasopharyngeal swab, presence of viral mutation(s) within the areas targeted by this assay, and inadequate number of viral copies(<138 copies/mL). A negative result must be combined with clinical observations, patient history, and epidemiological information. The expected result is Negative.  Fact Sheet for Patients:  EntrepreneurPulse.com.au  Fact Sheet for Healthcare Providers:  IncredibleEmployment.be  This test is no t yet approved or cleared by the [place] FDA and  has been authorized for detection and/or diagnosis of SARS-CoV-2 by FDA under an Emergency Use Authorization (EUA). This EUA will remain  in effect (meaning this test can be used) for the duration of the COVID-19 declaration under Section 564(b)(1) of the Act, 21 U.S.C.section 360bbb-3(b)(1),  unless the authorization is terminated  or revoked sooner.       Influenza A by PCR NEGATIVE NEGATIVE Final   Influenza B by PCR NEGATIVE NEGATIVE Final    Comment: (NOTE) The Xpert Xpress SARS-CoV-2/FLU/RSV plus assay is intended as an aid in the diagnosis of influenza from Nasopharyngeal swab specimens and should not be used as a sole basis for treatment. Nasal washings and aspirates are unacceptable for Xpert Xpress SARS-CoV-2/FLU/RSV testing.  Fact Sheet for Patients: EntrepreneurPulse.com.au  Fact Sheet for Healthcare Providers: IncredibleEmployment.be  This test is not yet approved or cleared by the [place] FDA and has been authorized for detection and/or diagnosis of SARS-CoV-2 by FDA under an Emergency Use Authorization (EUA). This EUA will remain in effect (meaning this test can be used) for the duration of the COVID-19  declaration under Section 564(b)(1) of the Act, 21 U.S.C. section 360bbb-3(b)(1), unless the authorization is terminated or revoked.  Performed at [facility], [address] [place], [place] [address]      RN Pressure Injury Documentation:     Estimated body mass index is 34.18 kg/m as calculated from the following:   Height as of this encounter: 6' (1.829 m).   Weight as of this encounter: 114.3 kg.  Malnutrition Type:   Malnutrition Characteristics:   Nutrition Interventions:     Radiology Studies: CT HEAD WO CONTRAST  Result Date: 06/16/2021 CLINICAL DATA:  Dizziness, nonspecific. Blurred vision and hypertension. Slurred speech. EXAM: CT HEAD WITHOUT CONTRAST TECHNIQUE: Contiguous axial images were obtained from the base of the skull through the vertex without intravenous contrast. RADIATION DOSE REDUCTION: This exam was performed according to the departmental dose-optimization program which includes automated exposure control, adjustment of the mA and/or kV according to patient size  and/or use of iterative reconstruction technique. COMPARISON:  None. FINDINGS: Brain: No focal abnormality seen affecting the brainstem or cerebellum. Cerebral hemispheres show mild age related volume loss without evidence of old or acute focal infarction, mass lesion, hemorrhage, hydrocephalus or extra-axial collection. Vascular: There is atherosclerotic calcification of the major vessels at the base of the brain. Skull: Negative Sinuses/Orbits: Clear/normal Other: None IMPRESSION: No focal or acute brain finding. Mild age related volume loss. Atherosclerotic calcification of the major vessels at the base of the brain, usually seen at this age. Electronically Signed   By: Nelson Chimes M.D.   On: 06/16/2021 15:07   MR ANGIO NECK W WO CONTRAST  Result Date: 06/16/2021 CLINICAL DATA:  TIA EXAM: MRA NECK WITHOUT AND WITH CONTRAST TECHNIQUE: Multiplanar and multiecho pulse sequences of the neck were obtained without and with intravenous contrast. Angiographic images of the neck were obtained using MRA technique without and with intravenous contrast. CONTRAST:  69mL GADAVIST GADOBUTROL 1 MMOL/ML IV SOLN COMPARISON:  None. FINDINGS: Normal aortic branching. No evidence of dissection or significant stenosis of the origins of the branch vessels. Common, internal, and external carotid arteries are patent, without hemodynamically significant stenosis. Extracranial vertebral arteries are patent, without hemodynamically significant stenosis. The left vertebral artery is diminutive from its origin to the terminus. Right dominant system. IMPRESSION: No hemodynamically significant stenosis in the neck. Electronically Signed   By: Merilyn Baba M.D.   On: 06/16/2021 22:38   MR BRAIN WO CONTRAST  Result Date: 06/16/2021 CLINICAL DATA:  Neuro deficit, acute, stroke suspected EXAM: MRI HEAD WITHOUT CONTRAST TECHNIQUE: Multiplanar, multiecho pulse sequences of the brain and surrounding structures were obtained without  intravenous contrast. COMPARISON:  None. FINDINGS: Brain: There is no acute infarction or intracranial hemorrhage. There is no intracranial mass, mass effect, or edema. There is no hydrocephalus or extra-axial fluid collection. Ventricles and sulci are normal in size and configuration. Patchy foci of T2 hyperintensity in the supratentorial and pontine white matter are nonspecific but may reflect mild chronic microvascular ischemic changes. A punctate focus of susceptibility in the right frontal subcortical white matter likely reflects chronic microhemorrhage. Vascular: Major vessel flow voids at the skull base are preserved. Skull and upper cervical spine: Normal marrow signal is preserved. Sinuses/Orbits: Paranasal sinuses are aerated. Orbits are unremarkable. Other: Sella is unremarkable.  Mastoid air cells are clear. IMPRESSION: No acute infarction, hemorrhage, or mass. Mild chronic microvascular ischemic changes. Electronically Signed   By: Macy Mis M.D.   On: 06/16/2021 17:23   ECHOCARDIOGRAM COMPLETE BUBBLE STUDY  Result Date: 06/17/2021    ECHOCARDIOGRAM REPORT   Patient Name:   ASMAR BROZEK Date of Exam: 06/17/2021 Medical Rec #:  [id number]    Height:       72.0 in Accession #:    [id number]   Weight:       252.0 lb Date of Birth:  1950-12-27   BSA:          2.350 m Patient Age:    69 years     BP:           165/84 mmHg Patient Gender: M            HR:           72 bpm. Exam Location:  [facility] Procedure: 2D Echo, Color Doppler, Cardiac Doppler and Saline Contrast Bubble            Study Indications:     I63.9 Stroke  History:         Patient has no prior history of Echocardiogram examinations.                  Risk Factors:Hypertension.  Sonographer:     Charmayne Sheer Referring Phys:  [id number] Gretta Cool PATEL Diagnosing Phys: Nelva Bush MD  Sonographer Comments: Suboptimal subcostal window. IMPRESSIONS  1. Left ventricular ejection fraction, by estimation, is 50 to 55%. The left ventricle has low normal  function. The left ventricle has no regional wall motion abnormalities. There is mild left ventricular hypertrophy. Left ventricular diastolic parameters are consistent with Grade I diastolic dysfunction (impaired relaxation).  2. Right ventricular systolic function is normal. The right ventricular size is normal. Tricuspid regurgitation signal is inadequate for assessing PA pressure.  3. Left atrial size was mildly dilated.  4. Right atrial size was mildly dilated.  5. The mitral valve is degenerative. Trivial mitral valve regurgitation. No evidence of mitral stenosis.  6. The aortic valve has an indeterminant number of cusps. Aortic valve regurgitation is not visualized. No aortic stenosis is present.  7. Aortic dilatation noted. There is borderline dilatation of the aortic root, measuring 39 mm.  8. The inferior vena cava is dilated in size with <50% respiratory variability, suggesting right atrial pressure of 15 mmHg.  9. Bubble study is non-diagnostic for intracardiac shunting. FINDINGS  Left Ventricle: Left ventricular ejection fraction, by estimation, is 50 to 55%. The left ventricle has low normal function. The left ventricle has no regional wall motion abnormalities. The left ventricular internal cavity size was normal in size. There is mild left ventricular hypertrophy. Left ventricular diastolic parameters are consistent with Grade I diastolic dysfunction (impaired relaxation). Right Ventricle: The right ventricular size is normal. No increase in right ventricular wall thickness. Right ventricular systolic function is normal. Tricuspid regurgitation signal is inadequate for assessing PA pressure. Left Atrium: Left atrial size was mildly dilated. Right Atrium: Right atrial size was mildly dilated. Pericardium: There is no evidence of pericardial effusion. Mitral Valve: The mitral valve is degenerative in appearance. There is mild thickening of the mitral valve leaflet(s). Mild mitral annular calcification.  Trivial mitral valve regurgitation. No evidence of mitral valve stenosis. MV peak gradient, 4.8 mmHg. The mean mitral valve gradient is 2.0 mmHg. Tricuspid Valve: The tricuspid valve is grossly normal. Tricuspid valve regurgitation is not demonstrated. Aortic Valve: The aortic valve has an indeterminant number of cusps. Aortic valve regurgitation is not visualized. No aortic stenosis is present. Aortic valve mean gradient measures 3.0 mmHg. Aortic valve peak gradient measures  5.3 mmHg. Aortic valve area, by VTI measures 2.60 cm. Pulmonic Valve: The pulmonic valve was not well visualized. Pulmonic valve regurgitation is not visualized. No evidence of pulmonic stenosis. Aorta: Aortic dilatation noted. There is borderline dilatation of the aortic root, measuring 39 mm. Pulmonary Artery: The pulmonary artery is of normal size. Venous: The inferior vena cava is dilated in size with less than 50% respiratory variability, suggesting right atrial pressure of 15 mmHg. IAS/Shunts: The interatrial septum was not well visualized. Agitated saline contrast was given intravenously to evaluate for intracardiac shunting. Bubble study is non-diagnostic for intracardiac shunting.  LEFT VENTRICLE PLAX 2D LVIDd:         4.22 cm   Diastology LVIDs:         2.93 cm   LV e' medial:    6.42 cm/s LV PW:         1.21 cm   LV E/e' medial:  11.6 LV IVS:        1.11 cm   LV e' lateral:   7.29 cm/s LVOT diam:     2.10 cm   LV E/e' lateral: 10.2 LV SV:         53 LV SV Index:   23 LVOT Area:     3.46 cm  RIGHT VENTRICLE RV Basal diam:  3.77 cm LEFT ATRIUM             Index        RIGHT ATRIUM           Index LA diam:        4.20 cm 1.79 cm/m   RA Area:     18.40 cm LA Vol (A2C):   73.0 ml 31.06 ml/m  RA Volume:   49.50 ml  21.06 ml/m LA Vol (A4C):   64.1 ml 27.27 ml/m LA Biplane Vol: 73.9 ml 31.44 ml/m  AORTIC VALVE                    PULMONIC VALVE AV Area (Vmax):    2.18 cm     PV Vmax:       0.87 m/s AV Area (Vmean):   2.44 cm     PV  Vmean:      63.400 cm/s AV Area (VTI):     2.60 cm     PV VTI:        0.160 m AV Vmax:           115.00 cm/s  PV Peak grad:  3.0 mmHg AV Vmean:          74.100 cm/s  PV Mean grad:  2.0 mmHg AV VTI:            0.205 m AV Peak Grad:      5.3 mmHg AV Mean Grad:      3.0 mmHg LVOT Vmax:         72.50 cm/s LVOT Vmean:        52.100 cm/s LVOT VTI:          0.154 m LVOT/AV VTI ratio: 0.75  AORTA Ao Root diam: 3.90 cm MITRAL VALVE MV Area (PHT): 4.06 cm    SHUNTS MV Area VTI:   2.38 cm    Systemic VTI:  0.15 m MV Peak grad:  4.8 mmHg    Systemic Diam: 2.10 cm MV Mean grad:  2.0 mmHg MV Vmax:       1.10 m/s MV Vmean:      60.9 cm/s  MV Decel Time: 187 msec MV E velocity: 74.60 cm/s MV A velocity: 75.40 cm/s MV E/A ratio:  0.99 Christopher End MD Electronically signed by Nelva Bush MD Signature Date/Time: 06/17/2021/1:38:06 PM    Final     Scheduled Meds:  aspirin  325 mg Oral Daily   atorvastatin  40 mg Oral Daily   febuxostat  40 mg Oral Daily   heparin  5,000 Units Subcutaneous Q8H   Continuous Infusions:  sodium chloride Stopped (06/16/21 2154)    LOS: 1 day   Kerney Elbe, DO [facility] PAGER is on AMION  If 7PM-7AM, please contact night-coverage [email or web address]

## 2021-06-18 LAB — HEMOGLOBIN A1C
Hgb A1c MFr Bld: 6 % — ABNORMAL HIGH (ref 4.8–5.6)
Mean Plasma Glucose: 126 mg/dL

## 2021-08-11 ENCOUNTER — Ambulatory Visit: Payer: Self-pay | Admitting: Urology

## 2022-02-25 ENCOUNTER — Ambulatory Visit: Payer: Medicare Other | Admitting: Dermatology

## 2022-03-19 ENCOUNTER — Other Ambulatory Visit: Payer: Medicare Other

## 2022-03-25 ENCOUNTER — Ambulatory Visit: Payer: Medicare Other | Admitting: Urology

## 2023-01-21 ENCOUNTER — Encounter: Payer: Self-pay | Admitting: Podiatry

## 2023-01-21 ENCOUNTER — Ambulatory Visit (INDEPENDENT_AMBULATORY_CARE_PROVIDER_SITE_OTHER): Payer: Medicare Other | Admitting: Podiatry

## 2023-01-21 DIAGNOSIS — D2371 Other benign neoplasm of skin of right lower limb, including hip: Secondary | ICD-10-CM

## 2023-01-21 NOTE — Progress Notes (Signed)
   Chief Complaint  Patient presents with   Callouses    "I have a place on the ball of my right foot that bothers me." N - callus L - 5th met plantar D - 2 mos O - suddenly, about the same C - sharp pain intermitten, feels like a hot poker is sticking in it A - none T - prop foot up    Subjective: 72 y.o. male presenting to the office today for evaluation of pain and tenderness associated to the plantar aspect of the right foot around the fifth MTP.  Gradual onset over the past several months.  He does admit to walking around the house barefoot.  He says there is a symptomatic skin lesion to the plantar aspect of the foot that causes pain especially when going barefoot   Past Medical History:  Diagnosis Date   Arthritis    Bilateral hearing loss    ED (erectile dysfunction)    Gout    Hypertension    Nocturia    Prostate cancer (HCC)     Past Surgical History:  Procedure Laterality Date   CHOLECYSTECTOMY     KNEE SURGERY Right    seed implantation prostate     TONSILECTOMY, ADENOIDECTOMY, BILATERAL MYRINGOTOMY AND TUBES      Allergies  Allergen Reactions   Red Maple (Acer Rubrum) Allergy Skin Test     Other reaction(s): Other (See Comments) Flu like symptoms     Objective:  Physical Exam General: Alert and oriented x3 in no acute distress  Dermatology: Hyperkeratotic lesion(s) present on the plantar aspect of the fifth MTP right. Pain on palpation with a central nucleated core noted. Skin is warm, dry and supple bilateral lower extremities. Negative for open lesions or macerations.  Vascular: Palpable pedal pulses bilaterally. No edema or erythema noted. Capillary refill within normal limits.  Neurological: Grossly intact via light touch  Musculoskeletal Exam: Pain on palpation at the keratotic lesion(s) noted. Range of motion within normal limits bilateral. Muscle strength 5/5 in all groups bilateral.  Assessment: 1.  Symptomatic benign skin  lesion   Plan of Care:  -Patient evaluated -Excisional debridement of keratoic lesion(s) using a chisel blade was performed without incident.  -Salicylic acid applied with a bandaid -Return to the clinic PRN.   Felecia Shelling, DPM [facility]  Dr. Felecia Shelling, DPM    [address] [place], [place] [address]                Office [phone]  Fax [phone]

## 2023-02-10 ENCOUNTER — Encounter: Payer: Self-pay | Admitting: Physical Therapy

## 2023-02-10 ENCOUNTER — Ambulatory Visit: Payer: Medicare Other | Attending: Orthopedic Surgery | Admitting: Physical Therapy

## 2023-02-10 DIAGNOSIS — R262 Difficulty in walking, not elsewhere classified: Secondary | ICD-10-CM | POA: Diagnosis present

## 2023-02-10 DIAGNOSIS — G8929 Other chronic pain: Secondary | ICD-10-CM | POA: Insufficient documentation

## 2023-02-10 DIAGNOSIS — M25562 Pain in left knee: Secondary | ICD-10-CM | POA: Insufficient documentation

## 2023-02-10 NOTE — Therapy (Signed)
OUTPATIENT PHYSICAL THERAPY LOWER EXTREMITY EVALUATION   Patient Name: Steve Sampson MRN: [id number] DOB:06-02-1950, 72 y.o., male Today's Date: 02/10/2023  END OF SESSION:  PT End of Session - 02/10/23 1514     Visit Number 1    Number of Visits 20    Date for PT Re-Evaluation 04/21/23    Authorization Type Medicare 2024    Authorization - Visit Number 1    Authorization - Number of Visits 20    Progress Note Due on Visit 10    PT Start Time 1345    PT Stop Time 1430    PT Time Calculation (min) 45 min    Activity Tolerance Patient tolerated treatment well    Behavior During Therapy WFL for tasks assessed/performed             Past Medical History:  Diagnosis Date   Arthritis    Bilateral hearing loss    ED (erectile dysfunction)    Gout    Hypertension    Nocturia    Prostate cancer (HCC)    Past Surgical History:  Procedure Laterality Date   CHOLECYSTECTOMY     KNEE SURGERY Right    seed implantation prostate     TONSILECTOMY, ADENOIDECTOMY, BILATERAL MYRINGOTOMY AND TUBES     Patient Active Problem List   Diagnosis Date Noted   Slurred speech 06/16/2021   HTN, goal below 140/90 07/24/2020   Chronic gouty arthritis 07/24/2020   Personal history of prostate cancer 07/24/2020    PCP: Dr. Aram Beecham   REFERRING PROVIDER: Dr. Dorthula Nettles   REFERRING DIAG: Chronic left knee pain   THERAPY DIAG:  Chronic pain of left knee - Plan: PT plan of care cert/re-cert  Difficulty in walking, not elsewhere classified - Plan: PT plan of care cert/re-cert  Rationale for Evaluation and Treatment: Rehabilitation  ONSET DATE: 1-2 years ago  SUBJECTIVE:   SUBJECTIVE STATEMENT: See pertinent history   PERTINENT HISTORY: Pt had received injection from Dr.Kraskinski about a month ago for left knee pain. He recently fell a couple of months ago back in July having slipped off back deck and landing on left knee. Recently, his left lateral knee pain has gone  down gradually. However, his left knee intermittently locks up on him especially after getting up from seated position after a prolonged amount of time. Since the fall, he now has an palpable bump over lateral surface of left knee. He has a long history of physical activity with  PAIN:  Are you having pain? Yes: NPRS scale: 3-4/10 Pain location: Lateral joint line of left knee  Pain description: Achy  Aggravating factors: Standing and walking  Relieving factors: Biofreeze and voltaren (does not help much)  PRECAUTIONS: None  RED FLAGS: None   WEIGHT BEARING RESTRICTIONS: No  FALLS:  Has patient fallen in last 6 months? Yes. Number of falls 1, slipping on back deck   LIVING ENVIRONMENT: Lives with: lives with their family Lives in: House/apartment Stairs: Yes: Internal: 13 steps; on right going up and External: 3 steps; on right going up Has following equipment at home: Single point cane  OCCUPATION: Retired   PLOF: Independent  PATIENT GOALS: To reduce left knee pain especially while walking longer distance or upstairs   NEXT MD VISIT: Dr. Martha Clan    OBJECTIVE:   VITALS: BP 172/79   DIAGNOSTIC FINDINGS: None listed from [facility]  PATIENT SURVEYS:  FOTO 59 with target of 23   COGNITION: Overall cognitive status: Within functional  limits for tasks assessed     SENSATION: WFL  EDEMA:  Circumferential: NT   MUSCLE LENGTH: Hamstrings: Right 80 deg; Left 80 deg Thomas test: NT   POSTURE: No Significant postural limitations  PALPATION: Left fibular head and patella TTP   LOWER EXTREMITY ROM:  Active ROM Right eval Left eval  Hip flexion    Hip extension    Hip abduction    Hip adduction    Hip internal rotation    Hip external rotation    Knee flexion 122 122  Knee extension 0 0  Ankle dorsiflexion    Ankle plantarflexion    Ankle inversion    Ankle eversion     (Blank rows = not tested)  LOWER EXTREMITY MMT:  MMT Right eval Left eval  Hip  flexion 4+ 4+  Hip extension    Hip abduction    Hip adduction    Hip internal rotation    Hip external rotation    Knee flexion 4+ 4+  Knee extension 4+ 4+  Ankle dorsiflexion    Ankle plantarflexion    Ankle inversion    Ankle eversion     (Blank rows = not tested)  Knee Ext 1 RM R/L 85/85   LOWER EXTREMITY SPECIAL TESTS:  Knee special tests: Ligamentous testing- negative to all   FUNCTIONAL TESTS:  Squat- no deficits noted and no increased pain   GAIT: Distance walked: 50 ft   Assistive device utilized: None Level of assistance: Complete Independence Comments: No gait deficits noted    TODAY'S TREATMENT:                                                                                                                              DATE:   02/10/23   Seated HS Stretch 2 x 30 sec   PATIENT EDUCATION:  Education details: form and technique for correct performance of exercise  Person educated: Patient Education method: Explanation, Demonstration, Verbal cues, and Handouts Education comprehension: returned demonstration  HOME EXERCISE PROGRAM: Access Code: [email or web address] URL: [email or web address] Date: 02/10/2023 Prepared by: Ellin Goodie  Exercises - Seated Hamstring Stretch  - 1 x daily - 3 reps - 30-60 sec  hold  ASSESSMENT:  CLINICAL IMPRESSION: Patient is a 72 y.o. white male who was seen today for physical therapy evaluation and treatment for chronic left knee pain. He demonstrates signs and symptoms of left knee osteoarthritis and patellofemoral pain syndrome as evidenced by increased anterior knee pain while ascending and descending stairs, increased BMI, age greater than 50, complaints of clicking and catching, and insidious onset. Unable to fully rule in or rule out diagnosis for left fibular head pain and swelling. However, seems most likely to be a bone bruise that is still healing with pain with prolonged standing and walking and tenderness to  palpation along with increased swelling. He shows deficits that include increased left knee pain while negotiating stairs and difficulty walking for prolonged distances.  He will benefit from skilled PT to address the aforementioned deficits for pt to return to walking and standing to complete home improvement tasks without experiencing increased pain.   OBJECTIVE IMPAIRMENTS: difficulty walking, increased edema, impaired flexibility, and pain.   ACTIVITY LIMITATIONS: carrying, lifting, bending, standing, and stairs  PARTICIPATION LIMITATIONS: shopping, community activity, yard work, and remodeling home   PERSONAL FACTORS: 1-2 comorbidities: Gouty arthritis and HTN   are also affecting patient's functional outcome.   REHAB POTENTIAL: Good  CLINICAL DECISION MAKING: Stable/uncomplicated  EVALUATION COMPLEXITY: Low   GOALS: Goals reviewed with patient? No  SHORT TERM GOALS: Target date: 02/24/2023  Pt will be independent with HEP in order to improve strength and balance in order to decrease fall risk and improve function at home and work. Baseline: Not able to perform independently  Goal status: INITIAL  2.  Patient will show understanding of how to negotiate stairs with proper mechanics with ascending with good knee and descending with bad knee to avoid left knee pain exacerbation.  Baseline: Not able to perform independently  Goal status: INITIAL    LONG TERM GOALS: Target date: 04/21/2023  Patient will have improved function and activity level as evidenced by an increase in FOTO score by 10 points or more.  Baseline: 59 with target of 67  Goal status: INITIAL  2.  Patient will negotiate a full flight of stairs (13 steps) without experiencing left knee pain >=3/10 NRPS for improved mobility and left knee function.  Baseline: 5/10 NRPS of left knee  Goal status: INITIAL   PLAN:  PT FREQUENCY: 1-2x/week  PT DURATION: 10 weeks  PLANNED INTERVENTIONS: Therapeutic exercises,  Therapeutic activity, Neuromuscular re-education, Balance training, Gait training, Patient/Family education, Self Care, Joint mobilization, Joint manipulation, Stair training, Aquatic Therapy, Dry Needling, Electrical stimulation, Cryotherapy, Moist heat, Traction, Manual therapy, and Re-evaluation  PLAN FOR NEXT SESSION: OMEGA Leg Press, OMEGA HS Curl, Knee Circumference Measurement, Palpation of surrounding knee musculature.    Ellin Goodie PT, DPT  [facility] [address] [place], [address] Phone: [phone]   Fax:  [phone]

## 2023-02-15 ENCOUNTER — Telehealth: Payer: Self-pay | Admitting: Physical Therapy

## 2023-02-15 ENCOUNTER — Ambulatory Visit: Payer: Medicare Other | Admitting: Physical Therapy

## 2023-02-15 NOTE — Telephone Encounter (Addendum)
Called pt to inquire about absence from PT. Pt reports not knowing about apt and that he will make sure to be at next appointment.

## 2023-02-15 NOTE — Telephone Encounter (Signed)
Called pt to offer available slots for pt to come to because of missed appointment earlier in the day. Did not reach so left VM.

## 2023-02-17 ENCOUNTER — Ambulatory Visit: Payer: Medicare Other | Admitting: Physical Therapy

## 2023-02-17 DIAGNOSIS — R262 Difficulty in walking, not elsewhere classified: Secondary | ICD-10-CM

## 2023-02-17 DIAGNOSIS — M25562 Pain in left knee: Secondary | ICD-10-CM | POA: Diagnosis not present

## 2023-02-17 DIAGNOSIS — G8929 Other chronic pain: Secondary | ICD-10-CM

## 2023-02-17 NOTE — Therapy (Signed)
OUTPATIENT PHYSICAL THERAPY LOWER EXTREMITY TREATMENT   Patient Name: Steve Sampson MRN: [id number] DOB:03-15-1951, 72 y.o., male Today's Date: 02/17/2023  END OF SESSION:  PT End of Session - 02/17/23 0829     Visit Number 2    Number of Visits 20    Date for PT Re-Evaluation 04/21/23    Authorization Type Medicare 2024    Authorization - Visit Number 2    Authorization - Number of Visits 20    Progress Note Due on Visit 10    PT Start Time 0820    PT Stop Time 0900    PT Time Calculation (min) 40 min    Activity Tolerance Patient tolerated treatment well    Behavior During Therapy WFL for tasks assessed/performed              Past Medical History:  Diagnosis Date   Arthritis    Bilateral hearing loss    ED (erectile dysfunction)    Gout    Hypertension    Nocturia    Prostate cancer (HCC)    Past Surgical History:  Procedure Laterality Date   CHOLECYSTECTOMY     KNEE SURGERY Right    seed implantation prostate     TONSILECTOMY, ADENOIDECTOMY, BILATERAL MYRINGOTOMY AND TUBES     Patient Active Problem List   Diagnosis Date Noted   Slurred speech 06/16/2021   HTN, goal below 140/90 07/24/2020   Chronic gouty arthritis 07/24/2020   Personal history of prostate cancer 07/24/2020    PCP: Dr. Aram Beecham   REFERRING PROVIDER: Dr. Dorthula Nettles   REFERRING DIAG: Chronic left knee pain   THERAPY DIAG:  Chronic pain of left knee  Difficulty in walking, not elsewhere classified  Rationale for Evaluation and Treatment: Rehabilitation  ONSET DATE: 1-2 years ago  SUBJECTIVE:   SUBJECTIVE STATEMENT: Pt reports ongoing left knee pain and he experienced after driving in his car for a prolonged amount of time.   PERTINENT HISTORY: Pt had received injection from Dr.Kraskinski about a month ago for left knee pain. He recently fell a couple of months ago back in July having slipped off back deck and landing on left knee. Recently, his left lateral knee  pain has gone down gradually. However, his left knee intermittently locks up on him especially after getting up from seated position after a prolonged amount of time. Since the fall, he now has an palpable bump over lateral surface of left knee. He has a long history of physical activity with  PAIN:  Are you having pain? Yes: NPRS scale: 1-2/10 Pain location: Lateral joint line of left knee  Pain description: Achy  Aggravating factors: Standing and walking  Relieving factors: Biofreeze and voltaren (does not help much)  PRECAUTIONS: None  RED FLAGS: None   WEIGHT BEARING RESTRICTIONS: No  FALLS:  Has patient fallen in last 6 months? Yes. Number of falls 1, slipping on back deck   LIVING ENVIRONMENT: Lives with: lives with their family Lives in: House/apartment Stairs: Yes: Internal: 13 steps; on right going up and External: 3 steps; on right going up Has following equipment at home: Single point cane  OCCUPATION: Retired   PLOF: Independent  PATIENT GOALS: To reduce left knee pain especially while walking longer distance or upstairs   NEXT MD VISIT: Dr. Martha Clan    OBJECTIVE:   VITALS: BP 172/79   DIAGNOSTIC FINDINGS: None listed from [facility]  PATIENT SURVEYS:  FOTO 59 with target of 67   COGNITION: Overall  cognitive status: Within functional limits for tasks assessed     SENSATION: WFL  EDEMA:  Circumferential: NT   MUSCLE LENGTH: Hamstrings: Right 80 deg; Left 80 deg Thomas test: NT   POSTURE: No Significant postural limitations  PALPATION: Left fibular head and patella TTP   LOWER EXTREMITY ROM:  Active ROM Right eval Left eval  Hip flexion    Hip extension    Hip abduction    Hip adduction    Hip internal rotation    Hip external rotation    Knee flexion 122 122  Knee extension 0 0  Ankle dorsiflexion    Ankle plantarflexion    Ankle inversion    Ankle eversion     (Blank rows = not tested)  LOWER EXTREMITY MMT:  MMT Right eval  Left eval  Hip flexion 4+ 4+  Hip extension    Hip abduction    Hip adduction    Hip internal rotation    Hip external rotation    Knee flexion 4+ 4+  Knee extension 4+ 4+  Ankle dorsiflexion    Ankle plantarflexion    Ankle inversion    Ankle eversion     (Blank rows = not tested)  Knee Ext 1 RM R/L 85/85   LOWER EXTREMITY SPECIAL TESTS:  Knee special tests: Ligamentous testing- negative to all   FUNCTIONAL TESTS:  Squat- no deficits noted and no increased pain   GAIT: Distance walked: 50 ft   Assistive device utilized: None Level of assistance: Complete Independence Comments: No gait deficits noted    TODAY'S TREATMENT:                                                                                                                              DATE:   02/17/23: Matrix Recumbent Bicycle with seat at 11 and resistance at 3 for 5 min  OMEGA Knee Extension #45 3 x 10  Single leg Squat on LLE 1 x 5 -Unable to perform  Single Leg Sit to Stand on LLE with RLE offloaded on yoga block 3 x 10  Circumference R/L 17.5"/18"  Palpation: Medial surface to LLE fibular head    02/10/23   Seated HS Stretch 2 x 30 sec   PATIENT EDUCATION:  Education details: form and technique for correct performance of exercise  Person educated: Patient Education method: Explanation, Demonstration, Verbal cues, and Handouts Education comprehension: returned demonstration  HOME EXERCISE PROGRAM: Access Code: [email or web address] URL: [email or web address] Date: 02/17/2023 Prepared by: Ellin Goodie  Exercises - Seated Hamstring Stretch  - 1 x daily - 3 reps - 30-60 sec  hold - Long Sitting Calf Stretch with Strap  - 1 x daily - 3 reps - 30-60 sec sec  hold - Single Leg Sit to Stand with Arms Extended  - 3-4 x weekly - 3 sets - 10 reps  ASSESSMENT:  CLINICAL IMPRESSION: Pt presents for follow up after initial eval for left knee OA.  He shows decreased ankle mobility and tenderness to  palpation along surface medial to fibular head with possibility for extensor digitorum longus involvement. Pt also has increased swelling with left knee greater than right knee. He was able to complete all strengthening and stretching exercises without an increase in his medial left knee pain. He will continue to benefit from skilled PT to address the aforementioned deficits for pt to return to walking and standing to complete home improvement tasks without experiencing increased pain.   OBJECTIVE IMPAIRMENTS: difficulty walking, increased edema, impaired flexibility, and pain.   ACTIVITY LIMITATIONS: carrying, lifting, bending, standing, and stairs  PARTICIPATION LIMITATIONS: shopping, community activity, yard work, and remodeling home   PERSONAL FACTORS: 1-2 comorbidities: Gouty arthritis and HTN   are also affecting patient's functional outcome.   REHAB POTENTIAL: Good  CLINICAL DECISION MAKING: Stable/uncomplicated  EVALUATION COMPLEXITY: Low   GOALS: Goals reviewed with patient? No  SHORT TERM GOALS: Target date: 02/24/2023  Pt will be independent with HEP in order to improve strength and balance in order to decrease fall risk and improve function at home and work. Baseline: Not able to perform independently  Goal status: ONGOING   2.  Patient will show understanding of how to negotiate stairs with proper mechanics with ascending with good knee and descending with bad knee to avoid left knee pain exacerbation.  Baseline: Not able to perform independently  Goal status: ONGOING     LONG TERM GOALS: Target date: 04/21/2023  Patient will have improved function and activity level as evidenced by an increase in FOTO score by 10 points or more.  Baseline: 59 with target of 67  Goal status: ONGOING   2.  Patient will negotiate a full flight of stairs (13 steps) without experiencing left knee pain >=3/10 NRPS for improved mobility and left knee function.  Baseline: 5/10 NRPS of left  knee  Goal status: ONGOING    PLAN:  PT FREQUENCY: 1-2x/week  PT DURATION: 10 weeks  PLANNED INTERVENTIONS: Therapeutic exercises, Therapeutic activity, Neuromuscular re-education, Balance training, Gait training, Patient/Family education, Self Care, Joint mobilization, Joint manipulation, Stair training, Aquatic Therapy, Dry Needling, Electrical stimulation, Cryotherapy, Moist heat, Traction, Manual therapy, and Re-evaluation  PLAN FOR NEXT SESSION: OMEGA Leg Press, OMEGA HS Curl, and Single Leg Stance. Dry needling of extensor digitorum longus and tib anterior.   Ellin Goodie PT, DPT  [facility] [address] [place], [address] Phone: [phone]   Fax:  [phone]

## 2023-02-21 ENCOUNTER — Ambulatory Visit: Payer: Medicare Other | Admitting: Physical Therapy

## 2023-02-21 ENCOUNTER — Encounter: Payer: Self-pay | Admitting: Physical Therapy

## 2023-02-21 DIAGNOSIS — M25562 Pain in left knee: Secondary | ICD-10-CM | POA: Diagnosis not present

## 2023-02-21 DIAGNOSIS — R262 Difficulty in walking, not elsewhere classified: Secondary | ICD-10-CM

## 2023-02-21 DIAGNOSIS — G8929 Other chronic pain: Secondary | ICD-10-CM

## 2023-02-21 NOTE — Therapy (Signed)
OUTPATIENT PHYSICAL THERAPY LOWER EXTREMITY TREATMENT   Patient Name: Steve Sampson MRN: [id number] DOB:10/23/1950, 72 y.o., male Today's Date: 02/21/2023  END OF SESSION:  PT End of Session - 02/21/23 1346     Visit Number 3    Number of Visits 20    Date for PT Re-Evaluation 04/21/23    Authorization Type Medicare 2024    Authorization - Visit Number 3    Authorization - Number of Visits 20    Progress Note Due on Visit 10    PT Start Time 1345    PT Stop Time 1430    PT Time Calculation (min) 45 min    Activity Tolerance Patient tolerated treatment well    Behavior During Therapy WFL for tasks assessed/performed              Past Medical History:  Diagnosis Date   Arthritis    Bilateral hearing loss    ED (erectile dysfunction)    Gout    Hypertension    Nocturia    Prostate cancer (HCC)    Past Surgical History:  Procedure Laterality Date   CHOLECYSTECTOMY     KNEE SURGERY Right    seed implantation prostate     TONSILECTOMY, ADENOIDECTOMY, BILATERAL MYRINGOTOMY AND TUBES     Patient Active Problem List   Diagnosis Date Noted   Slurred speech 06/16/2021   HTN, goal below 140/90 07/24/2020   Chronic gouty arthritis 07/24/2020   Personal history of prostate cancer 07/24/2020    PCP: Dr. Aram Beecham   REFERRING PROVIDER: Dr. Dorthula Nettles   REFERRING DIAG: Chronic left knee pain   THERAPY DIAG:  Chronic pain of left knee  Difficulty in walking, not elsewhere classified  Rationale for Evaluation and Treatment: Rehabilitation  ONSET DATE: 1-2 years ago  SUBJECTIVE:   SUBJECTIVE STATEMENT: Pt states that he feels that he still has tenderness along the bony prominence fibular head. He still has swelling there and it mostly hurts when he is doing exercises.   PERTINENT HISTORY: Pt had received injection from Dr.Kraskinski about a month ago for left knee pain. He recently fell a couple of months ago back in July having slipped off back deck  and landing on left knee. Recently, his left lateral knee pain has gone down gradually. However, his left knee intermittently locks up on him especially after getting up from seated position after a prolonged amount of time. Since the fall, he now has an palpable bump over lateral surface of left knee. He has a long history of physical activity with  PAIN:  Are you having pain? Yes: NPRS scale: 1-2/10 Pain location: Lateral joint line of left knee  Pain description: Achy  Aggravating factors: Standing and walking  Relieving factors: Biofreeze and voltaren (does not help much)  PRECAUTIONS: None  RED FLAGS: None   WEIGHT BEARING RESTRICTIONS: No  FALLS:  Has patient fallen in last 6 months? Yes. Number of falls 1, slipping on back deck   LIVING ENVIRONMENT: Lives with: lives with their family Lives in: House/apartment Stairs: Yes: Internal: 13 steps; on right going up and External: 3 steps; on right going up Has following equipment at home: Single point cane  OCCUPATION: Retired   PLOF: Independent  PATIENT GOALS: To reduce left knee pain especially while walking longer distance or upstairs   NEXT MD VISIT: Dr. Martha Clan    OBJECTIVE:   VITALS: BP 172/79   DIAGNOSTIC FINDINGS: None listed from [facility]  PATIENT SURVEYS:  FOTO 59 with target of 67   COGNITION: Overall cognitive status: Within functional limits for tasks assessed     SENSATION: WFL  EDEMA:  Circumferential: NT   MUSCLE LENGTH: Hamstrings: Right 80 deg; Left 80 deg Thomas test: NT   POSTURE: No Significant postural limitations  PALPATION: Left fibular head and patella TTP   LOWER EXTREMITY ROM:  Active ROM Right eval Left eval  Hip flexion    Hip extension    Hip abduction    Hip adduction    Hip internal rotation    Hip external rotation    Knee flexion 122 122  Knee extension 0 0  Ankle dorsiflexion    Ankle plantarflexion    Ankle inversion    Ankle eversion     (Blank rows =  not tested)  LOWER EXTREMITY MMT:  MMT Right eval Left eval  Hip flexion 4+ 4+  Hip extension    Hip abduction    Hip adduction    Hip internal rotation    Hip external rotation    Knee flexion 4+ 4+  Knee extension 4+ 4+  Ankle dorsiflexion    Ankle plantarflexion    Ankle inversion    Ankle eversion     (Blank rows = not tested)  Knee Ext 1 RM R/L 85/85   LOWER EXTREMITY SPECIAL TESTS:  Knee special tests: Ligamentous testing- negative to all   FUNCTIONAL TESTS:  Squat- no deficits noted and no increased pain   GAIT: Distance walked: 50 ft   Assistive device utilized: None Level of assistance: Complete Independence Comments: No gait deficits noted    TODAY'S TREATMENT:                                                                                                                              DATE:   02/21/23: Matrix recumbent bicycle at seat 15 with resistance for 2 for 5 min  OMEGA Leg Press #65 1 x 10 OMEGA Leg Press #55 1 x 10  -Pt reports increased pain in the medial portion of his left knee   OMEGA Knee Extension #35 3 X 10  OMEGA HS Curl #35 3 x 10  Partial Lunge Squats with black resistance band around left knee pulling into flexion 1 X 10  Spanish Squat with black band on left knee 1 X 10 Spanish Squat  with black Rogue band on left knee 1 x 10  Spanish Squat with gray band on left knee and washcloth behind knee 1 x 10   02/17/23: Matrix Recumbent Bicycle with seat at 11 and resistance at 3 for 5 min  OMEGA Knee Extension #45 3 x 10  Single leg Squat on LLE 1 x 5 -Unable to perform  Single Leg Sit to Stand on LLE with RLE offloaded on yoga block 3 x 10  Circumference R/L 17.5"/18"  Palpation: Medial surface to LLE fibular head    02/10/23   Seated  HS Stretch 2 x 30 sec   PATIENT EDUCATION:  Education details: form and technique for correct performance of exercise  Person educated: Patient Education method: Explanation, Demonstration, Verbal  cues, and Handouts Education comprehension: returned demonstration  HOME EXERCISE PROGRAM: Access Code: [email or web address] URL: [email or web address] Date: 02/21/2023 Prepared by: Ellin Goodie  Exercises - Seated Hamstring Stretch  - 1 x daily - 3 reps - 30-60 sec  hold - Long Sitting Calf Stretch with Strap  - 1 x daily - 3 reps - 30-60 sec sec  hold - Single Leg Sit to Stand with Arms Extended  - 3-4 x weekly - 3 sets - 10 reps - Standing Squat with Resisted Terminal Knee Extension  - 3-4 x weekly - 3 sets - 10 reps  ASSESSMENT:  CLINICAL IMPRESSION: Pt's left knee pain remained stable throughout session with only increase occurring with leg press. Modified LE extensor exercise to include spanish squat to focus on increased quad activation which pt was able to perform without an increase in his left knee pain. Swelling along left fibular head has decreased since last session but continues to remain tender. Pt is going to work on getting x-rays from [facility] sent to clinic for further insight into underlying pathology of left knee. He will continue to benefit from skilled PT to address the aforementioned deficits for pt to return to walking and standing to complete home improvement tasks without experiencing increased pain.    OBJECTIVE IMPAIRMENTS: difficulty walking, increased edema, impaired flexibility, and pain.   ACTIVITY LIMITATIONS: carrying, lifting, bending, standing, and stairs  PARTICIPATION LIMITATIONS: shopping, community activity, yard work, and remodeling home   PERSONAL FACTORS: 1-2 comorbidities: Gouty arthritis and HTN   are also affecting patient's functional outcome.   REHAB POTENTIAL: Good  CLINICAL DECISION MAKING: Stable/uncomplicated  EVALUATION COMPLEXITY: Low   GOALS: Goals reviewed with patient? No  SHORT TERM GOALS: Target date: 02/24/2023  Pt will be independent with HEP in order to improve strength and balance in order to decrease fall  risk and improve function at home and work. Baseline: Not able to perform independently  Goal status: ONGOING   2.  Patient will show understanding of how to negotiate stairs with proper mechanics with ascending with good knee and descending with bad knee to avoid left knee pain exacerbation.  Baseline: Not able to perform independently  Goal status: ONGOING     LONG TERM GOALS: Target date: 04/21/2023  Patient will have improved function and activity level as evidenced by an increase in FOTO score by 10 points or more.  Baseline: 59 with target of 67  Goal status: ONGOING   2.  Patient will negotiate a full flight of stairs (13 steps) without experiencing left knee pain >=3/10 NRPS for improved mobility and left knee function.  Baseline: 5/10 NRPS of left knee  Goal status: ONGOING    PLAN:  PT FREQUENCY: 1-2x/week  PT DURATION: 10 weeks  PLANNED INTERVENTIONS: Therapeutic exercises, Therapeutic activity, Neuromuscular re-education, Balance training, Gait training, Patient/Family education, Self Care, Joint mobilization, Joint manipulation, Stair training, Aquatic Therapy, Dry Needling, Electrical stimulation, Cryotherapy, Moist heat, Traction, Manual therapy, and Re-evaluation  PLAN FOR NEXT SESSION: Single Leg Stance. [place] split squat. Dry needling of extensor digitorum longus and tib anterior.   Ellin Goodie PT, DPT  [facility] [address], [place], [address] Phone: [phone]   Fax:  [phone]

## 2023-02-24 ENCOUNTER — Ambulatory Visit: Payer: Medicare Other | Attending: Orthopedic Surgery | Admitting: Physical Therapy

## 2023-02-24 ENCOUNTER — Encounter: Payer: Self-pay | Admitting: Physical Therapy

## 2023-02-24 DIAGNOSIS — M25562 Pain in left knee: Secondary | ICD-10-CM | POA: Insufficient documentation

## 2023-02-24 DIAGNOSIS — G8929 Other chronic pain: Secondary | ICD-10-CM | POA: Insufficient documentation

## 2023-02-24 DIAGNOSIS — R262 Difficulty in walking, not elsewhere classified: Secondary | ICD-10-CM | POA: Diagnosis present

## 2023-02-24 NOTE — Therapy (Signed)
OUTPATIENT PHYSICAL THERAPY LOWER EXTREMITY TREATMENT   Patient Name: Steve Sampson MRN: [id number] DOB:1950/05/27, 72 y.o., male Today's Date: 02/24/2023  END OF SESSION:  PT End of Session - 02/24/23 1345     Visit Number 4    Number of Visits 20    Date for PT Re-Evaluation 04/21/23    Authorization Type Medicare 2024    Authorization - Number of Visits 20    Progress Note Due on Visit 10    PT Start Time 1345    PT Stop Time 1430    PT Time Calculation (min) 45 min    Activity Tolerance Patient tolerated treatment well    Behavior During Therapy WFL for tasks assessed/performed              Past Medical History:  Diagnosis Date   Arthritis    Bilateral hearing loss    ED (erectile dysfunction)    Gout    Hypertension    Nocturia    Prostate cancer (HCC)    Past Surgical History:  Procedure Laterality Date   CHOLECYSTECTOMY     KNEE SURGERY Right    seed implantation prostate     TONSILECTOMY, ADENOIDECTOMY, BILATERAL MYRINGOTOMY AND TUBES     Patient Active Problem List   Diagnosis Date Noted   Slurred speech 06/16/2021   HTN, goal below 140/90 07/24/2020   Chronic gouty arthritis 07/24/2020   Personal history of prostate cancer 07/24/2020    PCP: Dr. Aram Beecham   REFERRING PROVIDER: Dr. Dorthula Nettles   REFERRING DIAG: Chronic left knee pain   THERAPY DIAG:  Chronic pain of left knee  Difficulty in walking, not elsewhere classified  Rationale for Evaluation and Treatment: Rehabilitation  ONSET DATE: 1-2 years ago  SUBJECTIVE:   SUBJECTIVE STATEMENT: Pt states that his left knee is still painful but he feels like he is getting up and down the stairs better.   PERTINENT HISTORY: Pt had received injection from Dr.Kraskinski about a month ago for left knee pain. He recently fell a couple of months ago back in July having slipped off back deck and landing on left knee. Recently, his left lateral knee pain has gone down gradually.  However, his left knee intermittently locks up on him especially after getting up from seated position after a prolonged amount of time. Since the fall, he now has an palpable bump over lateral surface of left knee. He has a long history of physical activity with  PAIN:  Are you having pain? Yes: NPRS scale: 1-2/10 Pain location: Lateral joint line of left knee  Pain description: Achy  Aggravating factors: Standing and walking  Relieving factors: Biofreeze and voltaren (does not help much)  PRECAUTIONS: None  RED FLAGS: None   WEIGHT BEARING RESTRICTIONS: No  FALLS:  Has patient fallen in last 6 months? Yes. Number of falls 1, slipping on back deck   LIVING ENVIRONMENT: Lives with: lives with their family Lives in: House/apartment Stairs: Yes: Internal: 13 steps; on right going up and External: 3 steps; on right going up Has following equipment at home: Single point cane  OCCUPATION: Retired   PLOF: Independent  PATIENT GOALS: To reduce left knee pain especially while walking longer distance or upstairs   NEXT MD VISIT: Dr. Martha Clan    OBJECTIVE:   VITALS: BP 172/79   DIAGNOSTIC FINDINGS: None listed from [facility]  PATIENT SURVEYS:  FOTO 59 with target of 39   COGNITION: Overall cognitive status: Within functional limits for  tasks assessed     SENSATION: WFL  EDEMA:  Circumferential: NT   MUSCLE LENGTH: Hamstrings: Right 80 deg; Left 80 deg Thomas test: NT   POSTURE: No Significant postural limitations  PALPATION: Left fibular head and patella TTP   LOWER EXTREMITY ROM:  Active ROM Right eval Left eval  Hip flexion    Hip extension    Hip abduction    Hip adduction    Hip internal rotation    Hip external rotation    Knee flexion 122 122  Knee extension 0 0  Ankle dorsiflexion    Ankle plantarflexion    Ankle inversion    Ankle eversion     (Blank rows = not tested)  LOWER EXTREMITY MMT:  MMT Right eval Left eval  Hip flexion 4+ 4+   Hip extension    Hip abduction    Hip adduction    Hip internal rotation    Hip external rotation    Knee flexion 4+ 4+  Knee extension 4+ 4+  Ankle dorsiflexion    Ankle plantarflexion    Ankle inversion    Ankle eversion     (Blank rows = not tested)  Knee Ext 1 RM R/L 85/85   LOWER EXTREMITY SPECIAL TESTS:  Knee special tests: Ligamentous testing- negative to all   FUNCTIONAL TESTS:  Squat- no deficits noted and no increased pain   GAIT: Distance walked: 50 ft   Assistive device utilized: None Level of assistance: Complete Independence Comments: No gait deficits noted    TODAY'S TREATMENT:                                                                                                                              DATE:   02/24/23: Matrix recumbent bicycle at seat 15 with resistance for 2 for 5 min  Hip Abduction MMT R/L 4-/4 Lateral Step Down on 6 inch step 1 UE support 1 x 10  -medial joint pain  Lateral Step Ups on 6 inch step with 1 UE support  1 x 10  -medial joint pain  Side Lying Hip Abduction 3 x 10  Single Leg Stance R/L 5 x 10 sec  Modified Single Leg Stance on yoga block 2 x 30 sec  Modified single leg stance on cone  Toe Taps on yoga block  Single Leg Stance on LLE 2 cone tap x 10  Single Leg Stance on LLE 3 cone tap x 10   02/21/23: Matrix recumbent bicycle at seat 15 with resistance for 2 for 5 min  OMEGA Leg Press #65 1 x 10 OMEGA Leg Press #55 1 x 10  -Pt reports increased pain in the medial portion of his left knee   OMEGA Knee Extension #35 3 X 10  OMEGA HS Curl #35 3 x 10  Partial Lunge Squats with black resistance band around left knee pulling into flexion 1 X 10  Spanish Squat with black band on  left knee 1 X 10 Spanish Squat  with black Rogue band on left knee 1 x 10  Spanish Squat with gray band on left knee and washcloth behind knee 1 x 10   02/17/23: Matrix Recumbent Bicycle with seat at 11 and resistance at 3 for 5 min  OMEGA Knee  Extension #45 3 x 10  Single leg Squat on LLE 1 x 5 -Unable to perform  Single Leg Sit to Stand on LLE with RLE offloaded on yoga block 3 x 10  Circumference R/L 17.5"/18"  Palpation: Medial surface to LLE fibular head    PATIENT EDUCATION:  Education details: form and technique for correct performance of exercise  Person educated: Patient Education method: Explanation, Demonstration, Verbal cues, and Handouts Education comprehension: returned demonstration  HOME EXERCISE PROGRAM: Access Code: [email or web address] URL: [email or web address] Date: 02/24/2023 Prepared by: Ellin Goodie  Exercises - Seated Hamstring Stretch  - 1 x daily - 3 reps - 30-60 sec  hold - Long Sitting Calf Stretch with Strap  - 1 x daily - 3 reps - 30-60 sec sec  hold - Single Leg Sit to Stand with Arms Extended  - 3-4 x weekly - 3 sets - 10 reps - Standing Squat with Resisted Terminal Knee Extension  - 3-4 x weekly - 3 sets - 10 reps - Sidelying Diagonal Hip Abduction  - 3-4 x weekly - 3 sets - 10 reps  ASSESSMENT:  CLINICAL IMPRESSION: Pt shows some limitation to with sagittal plane movements with increased medial left knee pain with lateral step ups and downs. This is likely due to movements creating increase knee valgus moments. Modified hip abduction exercise to include side lying to avoid increased medial joint line pain on left knee. He will continue to benefit from skilled PT to address the aforementioned deficits for pt to return to walking and standing to complete home improvement tasks without experiencing increased pain.   OBJECTIVE IMPAIRMENTS: difficulty walking, increased edema, impaired flexibility, and pain.   ACTIVITY LIMITATIONS: carrying, lifting, bending, standing, and stairs  PARTICIPATION LIMITATIONS: shopping, community activity, yard work, and remodeling home   PERSONAL FACTORS: 1-2 comorbidities: Gouty arthritis and HTN   are also affecting patient's functional outcome.    REHAB POTENTIAL: Good  CLINICAL DECISION MAKING: Stable/uncomplicated  EVALUATION COMPLEXITY: Low   GOALS: Goals reviewed with patient? No  SHORT TERM GOALS: Target date: 02/24/2023  Pt will be independent with HEP in order to improve strength and balance in order to decrease fall risk and improve function at home and work. Baseline: Not able to perform independently  Goal status: ONGOING   2.  Patient will show understanding of how to negotiate stairs with proper mechanics with ascending with good knee and descending with bad knee to avoid left knee pain exacerbation.  Baseline: Not able to perform independently  Goal status: ONGOING     LONG TERM GOALS: Target date: 04/21/2023  Patient will have improved function and activity level as evidenced by an increase in FOTO score by 10 points or more.  Baseline: 59 with target of 67  Goal status: ONGOING   2.  Patient will negotiate a full flight of stairs (13 steps) without experiencing left knee pain >=3/10 NRPS for improved mobility and left knee function.  Baseline: 5/10 NRPS of left knee  Goal status: ONGOING    PLAN:  PT FREQUENCY: 1-2x/week  PT DURATION: 10 weeks  PLANNED INTERVENTIONS: Therapeutic exercises, Therapeutic activity, Neuromuscular re-education, Balance training, Gait training, Patient/Family education, Self  Care, Joint mobilization, Joint manipulation, Stair training, Aquatic Therapy, Dry Needling, Electrical stimulation, Cryotherapy, Moist heat, Traction, Manual therapy, and Re-evaluation  PLAN FOR NEXT SESSION: Backwards lunges and monster walks. Dry needling of extensor digitorum longus and tib anterior.   Ellin Goodie PT, DPT  [facility] [address] [place], [address] Phone: [phone]   Fax:  [phone]

## 2023-02-28 ENCOUNTER — Encounter: Payer: Self-pay | Admitting: Physical Therapy

## 2023-02-28 ENCOUNTER — Ambulatory Visit: Payer: Medicare Other | Admitting: Physical Therapy

## 2023-02-28 DIAGNOSIS — M25562 Pain in left knee: Secondary | ICD-10-CM | POA: Diagnosis not present

## 2023-02-28 DIAGNOSIS — R262 Difficulty in walking, not elsewhere classified: Secondary | ICD-10-CM

## 2023-02-28 DIAGNOSIS — G8929 Other chronic pain: Secondary | ICD-10-CM

## 2023-02-28 NOTE — Therapy (Signed)
OUTPATIENT PHYSICAL THERAPY LOWER EXTREMITY TREATMENT   Patient Name: Steve Sampson MRN: [id number] DOB:06/22/1950, 72 y.o., male Today's Date: 02/28/2023  END OF SESSION:  PT End of Session - 02/28/23 1302     Visit Number 5    Number of Visits 20    Date for PT Re-Evaluation 04/21/23    Authorization Type Medicare 2024    Authorization - Visit Number 5    Authorization - Number of Visits 20    Progress Note Due on Visit 10    PT Start Time 1300    PT Stop Time 1345    PT Time Calculation (min) 45 min    Activity Tolerance Patient tolerated treatment well    Behavior During Therapy WFL for tasks assessed/performed              Past Medical History:  Diagnosis Date   Arthritis    Bilateral hearing loss    ED (erectile dysfunction)    Gout    Hypertension    Nocturia    Prostate cancer (HCC)    Past Surgical History:  Procedure Laterality Date   CHOLECYSTECTOMY     KNEE SURGERY Right    seed implantation prostate     TONSILECTOMY, ADENOIDECTOMY, BILATERAL MYRINGOTOMY AND TUBES     Patient Active Problem List   Diagnosis Date Noted   Slurred speech 06/16/2021   HTN, goal below 140/90 07/24/2020   Chronic gouty arthritis 07/24/2020   Personal history of prostate cancer 07/24/2020    PCP: Dr. Aram Beecham   REFERRING PROVIDER: Dr. Dorthula Nettles   REFERRING DIAG: Chronic left knee pain   THERAPY DIAG:  Chronic pain of left knee  Difficulty in walking, not elsewhere classified  Rationale for Evaluation and Treatment: Rehabilitation  ONSET DATE: 1-2 years ago  SUBJECTIVE:   SUBJECTIVE STATEMENT: Pt reports that he has been feeling less pain around the left fibular head as of late. He spent a lot of time this weekend going up and downstairs so he did experience an increase in his left knee pain.   PERTINENT HISTORY: Pt had received injection from Dr.Kraskinski about a month ago for left knee pain. He recently fell a couple of months ago back in  July having slipped off back deck and landing on left knee. Recently, his left lateral knee pain has gone down gradually. However, his left knee intermittently locks up on him especially after getting up from seated position after a prolonged amount of time. Since the fall, he now has an palpable bump over lateral surface of left knee. He has a long history of physical activity with  PAIN:  Are you having pain? Yes: NPRS scale: 1-2/10 Pain location: Lateral joint line of left knee  Pain description: Achy  Aggravating factors: Standing and walking  Relieving factors: Biofreeze and voltaren (does not help much)  PRECAUTIONS: None  RED FLAGS: None   WEIGHT BEARING RESTRICTIONS: No  FALLS:  Has patient fallen in last 6 months? Yes. Number of falls 1, slipping on back deck   LIVING ENVIRONMENT: Lives with: lives with their family Lives in: House/apartment Stairs: Yes: Internal: 13 steps; on right going up and External: 3 steps; on right going up Has following equipment at home: Single point cane  OCCUPATION: Retired   PLOF: Independent  PATIENT GOALS: To reduce left knee pain especially while walking longer distance or upstairs   NEXT MD VISIT: Dr. Martha Clan    OBJECTIVE:   VITALS: BP 172/79  DIAGNOSTIC FINDINGS: None listed from [facility]  PATIENT SURVEYS:  FOTO 59 with target of 73   COGNITION: Overall cognitive status: Within functional limits for tasks assessed     SENSATION: WFL  EDEMA:  Circumferential: NT   MUSCLE LENGTH: Hamstrings: Right 80 deg; Left 80 deg Thomas test: NT   POSTURE: No Significant postural limitations  PALPATION: Left fibular head and patella TTP   LOWER EXTREMITY ROM:  Active ROM Right eval Left eval  Hip flexion    Hip extension    Hip abduction    Hip adduction    Hip internal rotation    Hip external rotation    Knee flexion 122 122  Knee extension 0 0  Ankle dorsiflexion    Ankle plantarflexion    Ankle inversion     Ankle eversion     (Blank rows = not tested)  LOWER EXTREMITY MMT:  MMT Right eval Left eval  Hip flexion 4+ 4+  Hip extension    Hip abduction    Hip adduction    Hip internal rotation    Hip external rotation    Knee flexion 4+ 4+  Knee extension 4+ 4+  Ankle dorsiflexion    Ankle plantarflexion    Ankle inversion    Ankle eversion     (Blank rows = not tested)  Knee Ext 1 RM R/L 85/85   LOWER EXTREMITY SPECIAL TESTS:  Knee special tests: Ligamentous testing- negative to all   FUNCTIONAL TESTS:  Squat- no deficits noted and no increased pain   GAIT: Distance walked: 50 ft   Assistive device utilized: None Level of assistance: Complete Independence Comments: No gait deficits noted    TODAY'S TREATMENT:                                                                                                                              DATE:   02/28/23: Matrix recumbent bicycle at seat 13 with resistance for 4 for 5 min  Monster Walks with red band 25 ft X 6 Side Step with red band 25 ft x 4 (band placed around ankles) Side Step with red band 25 ft x 4 (band placed around feet) Side Lying TFL with #2 lb ankle weight on RLE 2 x 10  Side Lying TFL with #3 lb ankle weight on RLE 1 x 10  Side Lying TFL with #3 lb ankle weight on LLE 3 x 10  Seated Bent Knee Extensions with #3 AW 2 x 20    02/24/23: Matrix recumbent bicycle at seat 15 with resistance for 2 for 5 min  Hip Abduction MMT R/L 4-/4 Lateral Step Down on 6 inch step 1 UE support 1 x 10  -medial joint pain  Lateral Step Ups on 6 inch step with 1 UE support  1 x 10  -medial joint pain  Side Lying Hip Abduction 3 x 10  Single Leg Stance R/L 5 x 10 sec  Modified Single Leg Stance on yoga block 2 x 30 sec  Modified single leg stance on cone  Toe Taps on yoga block  Single Leg Stance on LLE 2 cone tap x 10  Single Leg Stance on LLE 3 cone tap x 10   02/21/23: Matrix recumbent bicycle at seat 15 with resistance  for 2 for 5 min  OMEGA Leg Press #65 1 x 10 OMEGA Leg Press #55 1 x 10  -Pt reports increased pain in the medial portion of his left knee   OMEGA Knee Extension #35 3 X 10  OMEGA HS Curl #35 3 x 10  Partial Lunge Squats with black resistance band around left knee pulling into flexion 1 X 10  Spanish Squat with black band on left knee 1 X 10 Spanish Squat  with black Rogue band on left knee 1 x 10  Spanish Squat with gray band on left knee and washcloth behind knee 1 x 10   02/17/23: Matrix Recumbent Bicycle with seat at 11 and resistance at 3 for 5 min  OMEGA Knee Extension #45 3 x 10  Single leg Squat on LLE 1 x 5 -Unable to perform  Single Leg Sit to Stand on LLE with RLE offloaded on yoga block 3 x 10  Circumference R/L 17.5"/18"  Palpation: Medial surface to LLE fibular head    PATIENT EDUCATION:  Education details: form and technique for correct performance of exercise  Person educated: Patient Education method: Explanation, Demonstration, Verbal cues, and Handouts Education comprehension: returned demonstration  HOME EXERCISE PROGRAM: Access Code: [email or web address] URL: [email or web address] Date: 02/28/2023 Prepared by: Ellin Goodie  Exercises - Seated Hamstring Stretch  - 1 x daily - 3 reps - 30-60 sec  hold - Long Sitting Calf Stretch with Strap  - 1 x daily - 3 reps - 30-60 sec sec  hold - Sidelying Diagonal Hip Abduction  - 3-4 x weekly - 3 sets - 10 reps - IT band strengthening exercise   - 1 x daily - 3-4 x weekly - 3 sets - 10 reps - Lunge with Counter Support  - 3-4 x weekly - 3 sets - 10 reps   ASSESSMENT:  CLINICAL IMPRESSION: Pt continues to show improvement with left knee strength as evidenced by ability to tolerate strengthening exercises with decreased pain response. Focus of strengthening was on VMO for improved patellar tracking. Pt did feel increased left fibular head pain in weight bearing despite decrease in swelling. He will continue to  benefit from skilled PT to address the aforementioned deficits for pt to return to walking and standing to complete home improvement tasks without experiencing increased pain.   OBJECTIVE IMPAIRMENTS: difficulty walking, increased edema, impaired flexibility, and pain.   ACTIVITY LIMITATIONS: carrying, lifting, bending, standing, and stairs  PARTICIPATION LIMITATIONS: shopping, community activity, yard work, and remodeling home   PERSONAL FACTORS: 1-2 comorbidities: Gouty arthritis and HTN   are also affecting patient's functional outcome.   REHAB POTENTIAL: Good  CLINICAL DECISION MAKING: Stable/uncomplicated  EVALUATION COMPLEXITY: Low   GOALS: Goals reviewed with patient? No  SHORT TERM GOALS: Target date: 02/24/2023  Pt will be independent with HEP in order to improve strength and balance in order to decrease fall risk and improve function at home and work. Baseline: Not able to perform independently  Goal status: ONGOING   2.  Patient will show understanding of how to negotiate stairs with proper mechanics with ascending with good knee and descending with bad knee to avoid  left knee pain exacerbation.  Baseline: Not able to perform independently  Goal status: ONGOING     LONG TERM GOALS: Target date: 04/21/2023  Patient will have improved function and activity level as evidenced by an increase in FOTO score by 10 points or more.  Baseline: 59 with target of 67  Goal status: ONGOING   2.  Patient will negotiate a full flight of stairs (13 steps) without experiencing left knee pain >=3/10 NRPS for improved mobility and left knee function.  Baseline: 5/10 NRPS of left knee  Goal status: ONGOING    PLAN:  PT FREQUENCY: 1-2x/week  PT DURATION: 10 weeks  PLANNED INTERVENTIONS: Therapeutic exercises, Therapeutic activity, Neuromuscular re-education, Balance training, Gait training, Patient/Family education, Self Care, Joint mobilization, Joint manipulation, Stair  training, Aquatic Therapy, Dry Needling, Electrical stimulation, Cryotherapy, Moist heat, Traction, Manual therapy, and Re-evaluation  PLAN FOR NEXT SESSION: Reassess goals and continue to progress knee strengthening exercises. Fibular mobilizations. Dry needling of extensor digitorum longus and tib anterior.   Ellin Goodie PT, DPT  [facility] [address], [place], [address] Phone: [phone]   Fax:  [phone]

## 2023-03-02 ENCOUNTER — Ambulatory Visit: Payer: Medicare Other | Admitting: Physical Therapy

## 2023-03-02 ENCOUNTER — Encounter: Payer: Self-pay | Admitting: Physical Therapy

## 2023-03-02 DIAGNOSIS — G8929 Other chronic pain: Secondary | ICD-10-CM

## 2023-03-02 DIAGNOSIS — M25562 Pain in left knee: Secondary | ICD-10-CM | POA: Diagnosis not present

## 2023-03-02 DIAGNOSIS — R262 Difficulty in walking, not elsewhere classified: Secondary | ICD-10-CM

## 2023-03-02 NOTE — Therapy (Signed)
OUTPATIENT PHYSICAL THERAPY LOWER EXTREMITY TREATMENT   Patient Name: Steve Sampson MRN: [id number] DOB:March 15, 1951, 72 y.o., male Today's Date: 03/02/2023  END OF SESSION:  PT End of Session - 03/02/23 1606     Visit Number 6    Number of Visits 20    Date for PT Re-Evaluation 04/21/23    Authorization Type Medicare 2024    Authorization - Visit Number 6    Authorization - Number of Visits 20    Progress Note Due on Visit 10    PT Start Time 1604    PT Stop Time 1645    PT Time Calculation (min) 41 min    Activity Tolerance Patient tolerated treatment well    Behavior During Therapy WFL for tasks assessed/performed               Past Medical History:  Diagnosis Date   Arthritis    Bilateral hearing loss    ED (erectile dysfunction)    Gout    Hypertension    Nocturia    Prostate cancer (HCC)    Past Surgical History:  Procedure Laterality Date   CHOLECYSTECTOMY     KNEE SURGERY Right    seed implantation prostate     TONSILECTOMY, ADENOIDECTOMY, BILATERAL MYRINGOTOMY AND TUBES     Patient Active Problem List   Diagnosis Date Noted   Slurred speech 06/16/2021   HTN, goal below 140/90 07/24/2020   Chronic gouty arthritis 07/24/2020   Personal history of prostate cancer 07/24/2020    PCP: Dr. Aram Beecham   REFERRING PROVIDER: Dr. Dorthula Nettles   REFERRING DIAG: Chronic left knee pain   THERAPY DIAG:  Chronic pain of left knee  Difficulty in walking, not elsewhere classified  Rationale for Evaluation and Treatment: Rehabilitation  ONSET DATE: 1-2 years ago  SUBJECTIVE:   SUBJECTIVE STATEMENT: Pt reports that he has not been in much pain as of late and he views this as an improvement.   PERTINENT HISTORY: Pt had received injection from Dr.Kraskinski about a month ago for left knee pain. He recently fell a couple of months ago back in July having slipped off back deck and landing on left knee. Recently, his left lateral knee pain has gone  down gradually. However, his left knee intermittently locks up on him especially after getting up from seated position after a prolonged amount of time. Since the fall, he now has an palpable bump over lateral surface of left knee. He has a long history of physical activity with  PAIN:  Are you having pain? Yes: NPRS scale: 1-2/10 Pain location: Lateral joint line of left knee  Pain description: Achy  Aggravating factors: Standing and walking  Relieving factors: Biofreeze and voltaren (does not help much)  PRECAUTIONS: None  RED FLAGS: None   WEIGHT BEARING RESTRICTIONS: No  FALLS:  Has patient fallen in last 6 months? Yes. Number of falls 1, slipping on back deck   LIVING ENVIRONMENT: Lives with: lives with their family Lives in: House/apartment Stairs: Yes: Internal: 13 steps; on right going up and External: 3 steps; on right going up Has following equipment at home: Single point cane  OCCUPATION: Retired   PLOF: Independent  PATIENT GOALS: To reduce left knee pain especially while walking longer distance or upstairs   NEXT MD VISIT: Dr. Martha Clan    OBJECTIVE:   VITALS: BP 172/79   DIAGNOSTIC FINDINGS: None listed from [facility]  PATIENT SURVEYS:  FOTO 59 with target of 67   COGNITION:  Overall cognitive status: Within functional limits for tasks assessed     SENSATION: WFL  EDEMA:  Circumferential: NT   MUSCLE LENGTH: Hamstrings: Right 80 deg; Left 80 deg Thomas test: NT   POSTURE: No Significant postural limitations  PALPATION: Left fibular head and patella TTP   LOWER EXTREMITY ROM:  Active ROM Right eval Left eval  Hip flexion    Hip extension    Hip abduction    Hip adduction    Hip internal rotation    Hip external rotation    Knee flexion 122 122  Knee extension 0 0  Ankle dorsiflexion    Ankle plantarflexion    Ankle inversion    Ankle eversion     (Blank rows = not tested)  LOWER EXTREMITY MMT:  MMT Right eval Left eval  Hip  flexion 4+ 4+  Hip extension    Hip abduction    Hip adduction    Hip internal rotation    Hip external rotation    Knee flexion 4+ 4+  Knee extension 4+ 4+  Ankle dorsiflexion    Ankle plantarflexion    Ankle inversion    Ankle eversion     (Blank rows = not tested)  Knee Ext 1 RM R/L 85/85   LOWER EXTREMITY SPECIAL TESTS:  Knee special tests: Ligamentous testing- negative to all   FUNCTIONAL TESTS:  Squat- no deficits noted and no increased pain   GAIT: Distance walked: 50 ft   Assistive device utilized: None Level of assistance: Complete Independence Comments: No gait deficits noted    TODAY'S TREATMENT:                                                                                                                              DATE:   03/02/23: Matrix recumbent bicycle at seat 13 with resistance for 4 for 5 min  Side Step with Green band 25 ft  x 10  Straight Leg Raise with ER 3 x 10  Side Step Down on wooden board 1 x 10  -Pt reports increased medial knee pain  Reverse Lunges with BUE support 1 x 10  Reverse Lunges with 1 UE support with slider 1 x 10  -Pt reports increased medial knee pain  Reverse Lunges with 1 UE support 1 x 10  Sumo Squats 3 x 10  FOTO: 59    02/28/23: Matrix recumbent bicycle at seat 13 with resistance for 4 for 5 min  Monster Walks with red band 25 ft X 6 Side Step with red band 25 ft x 4 (band placed around ankles) Side Step with red band 25 ft x 4 (band placed around feet) Side Lying TFL with #2 lb ankle weight on RLE 2 x 10  Side Lying TFL with #3 lb ankle weight on RLE 1 x 10  Side Lying TFL with #3 lb ankle weight on LLE 3 x 10  Seated Bent Knee Extensions with #3  AW 2 x 20    02/24/23: Matrix recumbent bicycle at seat 15 with resistance for 2 for 5 min  Hip Abduction MMT R/L 4-/4 Lateral Step Down on 6 inch step 1 UE support 1 x 10  -medial joint pain  Lateral Step Ups on 6 inch step with 1 UE support  1 x 10  -medial joint  pain  Side Lying Hip Abduction 3 x 10  Single Leg Stance R/L 5 x 10 sec  Modified Single Leg Stance on yoga block 2 x 30 sec  Modified single leg stance on cone  Toe Taps on yoga block  Single Leg Stance on LLE 2 cone tap x 10  Single Leg Stance on LLE 3 cone tap x 10   02/21/23: Matrix recumbent bicycle at seat 15 with resistance for 2 for 5 min  OMEGA Leg Press #65 1 x 10 OMEGA Leg Press #55 1 x 10  -Pt reports increased pain in the medial portion of his left knee   OMEGA Knee Extension #35 3 X 10  OMEGA HS Curl #35 3 x 10  Partial Lunge Squats with black resistance band around left knee pulling into flexion 1 X 10  Spanish Squat with black band on left knee 1 X 10 Spanish Squat  with black Rogue band on left knee 1 x 10  Spanish Squat with gray band on left knee and washcloth behind knee 1 x 10    PATIENT EDUCATION:  Education details: form and technique for correct performance of exercise  Person educated: Patient Education method: Explanation, Demonstration, Verbal cues, and Handouts Education comprehension: returned demonstration  HOME EXERCISE PROGRAM: Access Code: [email or web address] URL: [email or web address] Date: 02/28/2023 Prepared by: Ellin Goodie  Exercises - Seated Hamstring Stretch  - 1 x daily - 3 reps - 30-60 sec  hold - Long Sitting Calf Stretch with Strap  - 1 x daily - 3 reps - 30-60 sec sec  hold - Sidelying Diagonal Hip Abduction  - 3-4 x weekly - 3 sets - 10 reps - IT band strengthening exercise   - 1 x daily - 3-4 x weekly - 3 sets - 10 reps - Lunge with Counter Support  - 3-4 x weekly - 3 sets - 10 reps   ASSESSMENT:  CLINICAL IMPRESSION: Pt continues to show improvement with left knee strength as evidenced by completing strengthening exercises with increased resistance. He continues to demonstrate increased pain with sagittal plane movements as evidenced by pain with side step down. He will continue to benefit from skilled PT to address the  aforementioned deficits for pt to return to walking and standing to complete home improvement tasks without experiencing increased pain.   OBJECTIVE IMPAIRMENTS: difficulty walking, increased edema, impaired flexibility, and pain.   ACTIVITY LIMITATIONS: carrying, lifting, bending, standing, and stairs  PARTICIPATION LIMITATIONS: shopping, community activity, yard work, and remodeling home   PERSONAL FACTORS: 1-2 comorbidities: Gouty arthritis and HTN   are also affecting patient's functional outcome.   REHAB POTENTIAL: Good  CLINICAL DECISION MAKING: Stable/uncomplicated  EVALUATION COMPLEXITY: Low   GOALS: Goals reviewed with patient? No  SHORT TERM GOALS: Target date: 02/24/2023  Pt will be independent with HEP in order to improve strength and balance in order to decrease fall risk and improve function at home and work. Baseline: Not able to perform independently  Goal status: ONGOING   2.  Patient will show understanding of how to negotiate stairs with proper mechanics with ascending with good knee and  descending with bad knee to avoid left knee pain exacerbation.  Baseline: Not able to perform independently  Goal status: ONGOING     LONG TERM GOALS: Target date: 04/21/2023  Patient will have improved function and activity level as evidenced by an increase in FOTO score by 10 points or more.  Baseline: 59 with target of 67 03/02/23: 59 Goal status: ONGOING   2.  Patient will negotiate a full flight of stairs (13 steps) without experiencing left knee pain >=3/10 NRPS for improved mobility and left knee function.  Baseline: 5/10 NRPS of left knee  Goal status: ONGOING    PLAN:  PT FREQUENCY: 1-2x/week  PT DURATION: 10 weeks  PLANNED INTERVENTIONS: Therapeutic exercises, Therapeutic activity, Neuromuscular re-education, Balance training, Gait training, Patient/Family education, Self Care, Joint mobilization, Joint manipulation, Stair training, Aquatic Therapy, Dry  Needling, Electrical stimulation, Cryotherapy, Moist heat, Traction, Manual therapy, and Re-evaluation  PLAN FOR NEXT SESSION: Reassess goals and continue to progress knee strengthening exercises. Dry needling of extensor digitorum longus and tib anterior. Progress knee strengthening and hip strengthening exercises: Matrix hip machine and stairs  Ellin Goodie PT, DPT  [facility] [address], [place], [address] Phone: [phone]   Fax:  [phone]

## 2023-03-08 ENCOUNTER — Ambulatory Visit: Payer: Medicare Other | Admitting: Physical Therapy

## 2023-03-08 ENCOUNTER — Encounter: Payer: Self-pay | Admitting: Physical Therapy

## 2023-03-08 DIAGNOSIS — M25562 Pain in left knee: Secondary | ICD-10-CM | POA: Diagnosis not present

## 2023-03-08 DIAGNOSIS — G8929 Other chronic pain: Secondary | ICD-10-CM

## 2023-03-08 DIAGNOSIS — R262 Difficulty in walking, not elsewhere classified: Secondary | ICD-10-CM

## 2023-03-08 NOTE — Therapy (Signed)
OUTPATIENT PHYSICAL THERAPY LOWER EXTREMITY DISCHARGE   Patient Name: Steve Sampson MRN: [id number] DOB:02/23/1951, 72 y.o., male Today's Date: 03/08/2023  END OF SESSION:  PT End of Session - 03/08/23 0904     Visit Number 7    Number of Visits 20    Date for PT Re-Evaluation 04/21/23    Authorization Type Medicare 2024    Authorization - Visit Number 7    Authorization - Number of Visits 20    Progress Note Due on Visit 10    PT Start Time 0903    PT Stop Time 0945    PT Time Calculation (min) 42 min    Activity Tolerance Patient tolerated treatment well    Behavior During Therapy WFL for tasks assessed/performed               Past Medical History:  Diagnosis Date   Arthritis    Bilateral hearing loss    ED (erectile dysfunction)    Gout    Hypertension    Nocturia    Prostate cancer (HCC)    Past Surgical History:  Procedure Laterality Date   CHOLECYSTECTOMY     KNEE SURGERY Right    seed implantation prostate     TONSILECTOMY, ADENOIDECTOMY, BILATERAL MYRINGOTOMY AND TUBES     Patient Active Problem List   Diagnosis Date Noted   Slurred speech 06/16/2021   HTN, goal below 140/90 07/24/2020   Chronic gouty arthritis 07/24/2020   Personal history of prostate cancer 07/24/2020    PCP: Dr. Aram Beecham   REFERRING PROVIDER: Dr. Dorthula Nettles   REFERRING DIAG: Chronic left knee pain   THERAPY DIAG:  Chronic pain of left knee  Difficulty in walking, not elsewhere classified  Rationale for Evaluation and Treatment: Rehabilitation  ONSET DATE: 1-2 years ago  SUBJECTIVE:   SUBJECTIVE STATEMENT: Pt continues to report improvements in left knee pain and ability to negotiate stairs without difficulty. He is able to do all his exercises  PERTINENT HISTORY: Pt had received injection from Dr.Kraskinski about a month ago for left knee pain. He recently fell a couple of months ago back in July having slipped off back deck and landing on left knee.  Recently, his left lateral knee pain has gone down gradually. However, his left knee intermittently locks up on him especially after getting up from seated position after a prolonged amount of time. Since the fall, he now has an palpable bump over lateral surface of left knee. He has a long history of physical activity with  PAIN:  Are you having pain? Yes: NPRS scale: 1-2/10 Pain location: Lateral joint line of left knee  Pain description: Achy  Aggravating factors: Standing and walking  Relieving factors: Biofreeze and voltaren (does not help much)  PRECAUTIONS: None  RED FLAGS: None   WEIGHT BEARING RESTRICTIONS: No  FALLS:  Has patient fallen in last 6 months? Yes. Number of falls 1, slipping on back deck   LIVING ENVIRONMENT: Lives with: lives with their family Lives in: House/apartment Stairs: Yes: Internal: 13 steps; on right going up and External: 3 steps; on right going up Has following equipment at home: Single point cane  OCCUPATION: Retired   PLOF: Independent  PATIENT GOALS: To reduce left knee pain especially while walking longer distance or upstairs   NEXT MD VISIT: Dr. Martha Clan    OBJECTIVE:   VITALS: BP 172/79   DIAGNOSTIC FINDINGS: None listed from [facility]  PATIENT SURVEYS:  FOTO 59 with target of 67  COGNITION: Overall cognitive status: Within functional limits for tasks assessed     SENSATION: WFL  EDEMA:  Circumferential: NT   MUSCLE LENGTH: Hamstrings: Right 80 deg; Left 80 deg Thomas test: NT   POSTURE: No Significant postural limitations  PALPATION: Left fibular head and patella TTP   LOWER EXTREMITY ROM:  Active ROM Right eval Left eval  Hip flexion    Hip extension    Hip abduction    Hip adduction    Hip internal rotation    Hip external rotation    Knee flexion 122 122  Knee extension 0 0  Ankle dorsiflexion    Ankle plantarflexion    Ankle inversion    Ankle eversion     (Blank rows = not tested)  LOWER  EXTREMITY MMT:  MMT Right eval Left eval  Hip flexion 4+ 4+  Hip extension    Hip abduction    Hip adduction    Hip internal rotation    Hip external rotation    Knee flexion 4+ 4+  Knee extension 4+ 4+  Ankle dorsiflexion    Ankle plantarflexion    Ankle inversion    Ankle eversion     (Blank rows = not tested)  Knee Ext 1 RM R/L 85/85   LOWER EXTREMITY SPECIAL TESTS:  Knee special tests: Ligamentous testing- negative to all   FUNCTIONAL TESTS:  Squat- no deficits noted and no increased pain   GAIT: Distance walked: 50 ft   Assistive device utilized: None Level of assistance: Complete Independence Comments: No gait deficits noted    TODAY'S TREATMENT:                                                                                                                              DATE:   03/08/23: Matrix recumbent bicycle at seat 13 with resistance for 4 for 5 min  Negotiation of stairs without proper mechanics with up with good and down with bad   -Pt able to perform independently   Lateral step up with left knee on 6 inch step 2 x 10  Discussion about progressions for HEP and use of medial unloader brace  03/02/23: Matrix recumbent bicycle at seat 13 with resistance for 4 for 5 min  Side Step with Green band 25 ft  x 10  Straight Leg Raise with ER 3 x 10  Side Step Down on wooden board 1 x 10  -Pt reports increased medial knee pain  Reverse Lunges with BUE support 1 x 10  Reverse Lunges with 1 UE support with slider 1 x 10  -Pt reports increased medial knee pain  Reverse Lunges with 1 UE support 1 x 10  Sumo Squats 3 x 10  FOTO: 59    02/28/23: Matrix recumbent bicycle at seat 13 with resistance for 4 for 5 min  Monster Walks with red band 25 ft X 6 Side Step with red band 25 ft x 4 (  band placed around ankles) Side Step with red band 25 ft x 4 (band placed around feet) Side Lying TFL with #2 lb ankle weight on RLE 2 x 10  Side Lying TFL with #3 lb ankle  weight on RLE 1 x 10  Side Lying TFL with #3 lb ankle weight on LLE 3 x 10  Seated Bent Knee Extensions with #3 AW 2 x 20    02/24/23: Matrix recumbent bicycle at seat 15 with resistance for 2 for 5 min  Hip Abduction MMT R/L 4-/4 Lateral Step Down on 6 inch step 1 UE support 1 x 10  -medial joint pain  Lateral Step Ups on 6 inch step with 1 UE support  1 x 10  -medial joint pain  Side Lying Hip Abduction 3 x 10  Single Leg Stance R/L 5 x 10 sec  Modified Single Leg Stance on yoga block 2 x 30 sec  Modified single leg stance on cone  Toe Taps on yoga block  Single Leg Stance on LLE 2 cone tap x 10  Single Leg Stance on LLE 3 cone tap x 10    PATIENT EDUCATION:  Education details: form and technique for correct performance of exercise  Person educated: Patient Education method: Explanation, Demonstration, Verbal cues, and Handouts Education comprehension: returned demonstration  HOME EXERCISE PROGRAM: Access Code: [email or web address] URL: [email or web address] Date: 03/08/2023 Prepared by: Ellin Goodie  Exercises - Seated Hamstring Stretch  - 1 x daily - 3 reps - 30-60 sec  hold - Long Sitting Calf Stretch with Strap  - 1 x daily - 3 reps - 30-60 sec sec  hold - Reverse Lunge on Slider  - 3-4 x weekly - 3 sets - 10 reps - Sumo Squat with Dumbbell  - 1 x daily - 3-4 x weekly - 3 sets - 10 reps - Band Walks  - 3-4 x weekly - 3 sets - 10 reps - Lateral Step Up with Counter Support  - 3-4 x weekly - 3 sets - 10 reps   ASSESSMENT:  CLINICAL IMPRESSION: Pt has now met all his rehab goals with improved stability in left knee to perform stair negotiation without excessive pain. He will continue to perform home exercise plan independently to maintain right knee functional gains. Pt is now ready for discharge.    OBJECTIVE IMPAIRMENTS: difficulty walking, increased edema, impaired flexibility, and pain.   ACTIVITY LIMITATIONS: carrying, lifting, bending, standing, and  stairs  PARTICIPATION LIMITATIONS: shopping, community activity, yard work, and remodeling home   PERSONAL FACTORS: 1-2 comorbidities: Gouty arthritis and HTN   are also affecting patient's functional outcome.   REHAB POTENTIAL: Good  CLINICAL DECISION MAKING: Stable/uncomplicated  EVALUATION COMPLEXITY: Low   GOALS: Goals reviewed with patient? No  SHORT TERM GOALS: Target date: 02/24/2023  Pt will be independent with HEP in order to improve strength and balance in order to decrease fall risk and improve function at home and work. Baseline: Not able to perform independently 03/08/23: Performing independently  Goal status: ACHIEVED   2.  Patient will show understanding of how to negotiate stairs with proper mechanics with ascending with good knee and descending with bad knee to avoid left knee pain exacerbation.  Baseline: Not able to perform independently 03/08/23: Able to perform independently  Goal status: ACHIEVED     LONG TERM GOALS: Target date: 04/21/2023  Patient will have improved function and activity level as evidenced by an increase in FOTO score by 10 points or more.  Baseline: 59 with target of 67 03/02/23: 59 Goal status: NOT MET   2.  Patient will negotiate a full flight of stairs (13 steps) without experiencing left knee pain >=3/10 NRPS for improved mobility and left knee function.  Baseline: 5/10 NRPS of left knee  03/08/23: 2-3/10 NRPS  Goal status: ACHIEVED    PLAN:  PT FREQUENCY: 1-2x/week  PT DURATION: 10 weeks  PLANNED INTERVENTIONS: Therapeutic exercises, Therapeutic activity, Neuromuscular re-education, Balance training, Gait training, Patient/Family education, Self Care, Joint mobilization, Joint manipulation, Stair training, Aquatic Therapy, Dry Needling, Electrical stimulation, Cryotherapy, Moist heat, Traction, Manual therapy, and Re-evaluation  PLAN FOR NEXT SESSION: Discharge from PT  Ellin Goodie PT, DPT  [facility] [address], [place], [address] Phone: [phone]   Fax:  [phone]

## 2023-03-15 ENCOUNTER — Encounter: Payer: TRICARE For Life (TFL) | Admitting: Physical Therapy

## 2023-03-22 ENCOUNTER — Encounter: Payer: TRICARE For Life (TFL) | Admitting: Physical Therapy

## 2023-03-28 ENCOUNTER — Encounter: Payer: TRICARE For Life (TFL) | Admitting: Physical Therapy

## 2023-03-30 ENCOUNTER — Encounter: Payer: TRICARE For Life (TFL) | Admitting: Physical Therapy

## 2023-04-04 ENCOUNTER — Encounter: Payer: TRICARE For Life (TFL) | Admitting: Physical Therapy

## 2023-04-06 ENCOUNTER — Encounter: Payer: TRICARE For Life (TFL) | Admitting: Physical Therapy

## 2023-04-11 ENCOUNTER — Encounter: Payer: TRICARE For Life (TFL) | Admitting: Physical Therapy

## 2023-04-13 ENCOUNTER — Encounter: Payer: TRICARE For Life (TFL) | Admitting: Physical Therapy

## 2023-04-18 ENCOUNTER — Encounter: Payer: TRICARE For Life (TFL) | Admitting: Physical Therapy

## 2023-04-20 ENCOUNTER — Encounter: Payer: TRICARE For Life (TFL) | Admitting: Physical Therapy

## 2023-04-25 ENCOUNTER — Encounter: Payer: TRICARE For Life (TFL) | Admitting: Physical Therapy

## 2023-04-27 ENCOUNTER — Encounter: Payer: TRICARE For Life (TFL) | Admitting: Physical Therapy

## 2023-05-02 ENCOUNTER — Encounter: Payer: TRICARE For Life (TFL) | Admitting: Physical Therapy

## 2023-05-04 ENCOUNTER — Encounter: Payer: TRICARE For Life (TFL) | Admitting: Physical Therapy

## 2023-05-09 ENCOUNTER — Encounter: Payer: TRICARE For Life (TFL) | Admitting: Physical Therapy

## 2023-05-09 ENCOUNTER — Other Ambulatory Visit: Payer: Self-pay | Admitting: Internal Medicine

## 2023-05-09 DIAGNOSIS — R1032 Left lower quadrant pain: Secondary | ICD-10-CM

## 2023-05-11 ENCOUNTER — Encounter: Payer: TRICARE For Life (TFL) | Admitting: Physical Therapy

## 2023-05-12 ENCOUNTER — Ambulatory Visit
Admission: RE | Admit: 2023-05-12 | Discharge: 2023-05-12 | Disposition: A | Discharge status: Home / Self Care | Payer: Medicare Other | Source: Ambulatory Visit | Attending: Internal Medicine | Admitting: Internal Medicine

## 2023-05-12 DIAGNOSIS — R1032 Left lower quadrant pain: Secondary | ICD-10-CM

## 2023-05-12 MED ORDER — IOPAMIDOL (ISOVUE-370) INJECTION 76%
80.0000 mL | Freq: Once | INTRAVENOUS | Status: AC | PRN
  Administered 2023-05-12: 80 mL via INTRAVENOUS

## 2023-05-16 ENCOUNTER — Encounter: Payer: TRICARE For Life (TFL) | Admitting: Physical Therapy

## 2023-05-19 ENCOUNTER — Encounter: Payer: TRICARE For Life (TFL) | Admitting: Physical Therapy

## 2024-03-15 ENCOUNTER — Ambulatory Visit: Admitting: Physical Therapy

## 2024-03-20 ENCOUNTER — Encounter: Admitting: Physical Therapy

## 2024-03-20 ENCOUNTER — Encounter: Attending: Physician Assistant | Admitting: Physician Assistant

## 2024-03-20 DIAGNOSIS — I10 Essential (primary) hypertension: Secondary | ICD-10-CM | POA: Diagnosis not present

## 2024-03-20 DIAGNOSIS — Z8546 Personal history of malignant neoplasm of prostate: Secondary | ICD-10-CM | POA: Diagnosis not present

## 2024-03-20 DIAGNOSIS — L97812 Non-pressure chronic ulcer of other part of right lower leg with fat layer exposed: Secondary | ICD-10-CM | POA: Insufficient documentation

## 2024-03-20 DIAGNOSIS — L02415 Cutaneous abscess of right lower limb: Secondary | ICD-10-CM | POA: Insufficient documentation

## 2024-03-20 DIAGNOSIS — L03115 Cellulitis of right lower limb: Secondary | ICD-10-CM | POA: Diagnosis present

## 2024-03-22 ENCOUNTER — Encounter: Admitting: Physical Therapy

## 2024-03-27 ENCOUNTER — Encounter: Admitting: Physical Therapy

## 2024-03-27 ENCOUNTER — Encounter: Attending: Physician Assistant | Admitting: Physician Assistant

## 2024-03-27 DIAGNOSIS — I10 Essential (primary) hypertension: Secondary | ICD-10-CM | POA: Insufficient documentation

## 2024-03-27 DIAGNOSIS — L03115 Cellulitis of right lower limb: Secondary | ICD-10-CM | POA: Insufficient documentation

## 2024-03-27 DIAGNOSIS — L97812 Non-pressure chronic ulcer of other part of right lower leg with fat layer exposed: Secondary | ICD-10-CM | POA: Diagnosis not present

## 2024-03-27 DIAGNOSIS — Z8546 Personal history of malignant neoplasm of prostate: Secondary | ICD-10-CM | POA: Diagnosis not present

## 2024-03-27 DIAGNOSIS — L02415 Cutaneous abscess of right lower limb: Secondary | ICD-10-CM | POA: Insufficient documentation

## 2024-03-29 ENCOUNTER — Ambulatory Visit: Admitting: Physical Therapy

## 2024-03-29 ENCOUNTER — Encounter: Admitting: Physical Therapy

## 2024-04-02 ENCOUNTER — Ambulatory Visit: Admitting: Physical Therapy

## 2024-04-02 ENCOUNTER — Encounter: Admitting: Physical Therapy

## 2024-04-02 ENCOUNTER — Encounter: Admitting: Physician Assistant

## 2024-04-03 ENCOUNTER — Encounter: Admitting: Physician Assistant

## 2024-04-03 DIAGNOSIS — L03115 Cellulitis of right lower limb: Secondary | ICD-10-CM | POA: Diagnosis not present

## 2024-04-05 ENCOUNTER — Encounter: Admitting: Physical Therapy

## 2024-04-09 ENCOUNTER — Encounter: Admitting: Physical Therapy

## 2024-04-10 ENCOUNTER — Encounter: Admitting: Physician Assistant

## 2024-04-10 DIAGNOSIS — L03115 Cellulitis of right lower limb: Secondary | ICD-10-CM | POA: Diagnosis not present

## 2024-04-11 ENCOUNTER — Encounter: Admitting: Physical Therapy

## 2024-04-12 ENCOUNTER — Encounter: Admitting: Physical Therapy

## 2024-04-12 ENCOUNTER — Ambulatory Visit: Admitting: Physical Therapy

## 2024-04-17 ENCOUNTER — Encounter: Admitting: Physician Assistant

## 2024-04-17 DIAGNOSIS — L03115 Cellulitis of right lower limb: Secondary | ICD-10-CM | POA: Diagnosis not present

## 2024-04-23 ENCOUNTER — Encounter: Admitting: Physical Therapy

## 2024-04-24 ENCOUNTER — Encounter: Admitting: Physical Therapy

## 2024-04-26 ENCOUNTER — Encounter: Admitting: Physical Therapy

## 2024-04-26 ENCOUNTER — Encounter: Attending: Physician Assistant | Admitting: Physician Assistant

## 2024-04-26 DIAGNOSIS — L03115 Cellulitis of right lower limb: Secondary | ICD-10-CM | POA: Insufficient documentation

## 2024-04-26 DIAGNOSIS — L97812 Non-pressure chronic ulcer of other part of right lower leg with fat layer exposed: Secondary | ICD-10-CM | POA: Insufficient documentation

## 2024-04-26 DIAGNOSIS — L02415 Cutaneous abscess of right lower limb: Secondary | ICD-10-CM | POA: Diagnosis not present

## 2024-05-01 ENCOUNTER — Encounter: Admitting: Internal Medicine

## 2024-05-01 ENCOUNTER — Encounter: Admitting: Physical Therapy

## 2024-05-03 ENCOUNTER — Ambulatory Visit: Admitting: Physical Therapy

## 2024-05-04 ENCOUNTER — Encounter: Admitting: Internal Medicine

## 2024-05-04 DIAGNOSIS — L03115 Cellulitis of right lower limb: Secondary | ICD-10-CM | POA: Diagnosis not present

## 2024-05-07 ENCOUNTER — Other Ambulatory Visit: Payer: Self-pay | Admitting: Internal Medicine

## 2024-05-07 DIAGNOSIS — M7989 Other specified soft tissue disorders: Secondary | ICD-10-CM

## 2024-05-08 ENCOUNTER — Ambulatory Visit: Admitting: Physical Therapy

## 2024-05-09 ENCOUNTER — Ambulatory Visit: Admission: RE | Admit: 2024-05-09 | Discharge: 2024-05-09 | Attending: Internal Medicine | Admitting: Internal Medicine

## 2024-05-09 DIAGNOSIS — M7989 Other specified soft tissue disorders: Secondary | ICD-10-CM | POA: Insufficient documentation

## 2024-05-10 ENCOUNTER — Encounter: Admitting: Internal Medicine

## 2024-05-10 ENCOUNTER — Ambulatory Visit: Admitting: Physical Therapy

## 2024-05-10 DIAGNOSIS — L03115 Cellulitis of right lower limb: Secondary | ICD-10-CM | POA: Diagnosis not present

## 2024-05-15 ENCOUNTER — Ambulatory Visit: Admitting: Physical Therapy

## 2024-05-21 ENCOUNTER — Ambulatory Visit: Admitting: Physical Therapy

## 2024-05-22 ENCOUNTER — Ambulatory Visit: Admitting: Physical Therapy

## 2024-05-28 ENCOUNTER — Ambulatory Visit: Admitting: Physical Therapy

## 2024-05-28 ENCOUNTER — Encounter: Attending: Physician Assistant | Admitting: Physician Assistant

## 2024-05-28 DIAGNOSIS — I10 Essential (primary) hypertension: Secondary | ICD-10-CM | POA: Diagnosis not present

## 2024-05-28 DIAGNOSIS — Z8546 Personal history of malignant neoplasm of prostate: Secondary | ICD-10-CM | POA: Insufficient documentation

## 2024-05-28 DIAGNOSIS — L97812 Non-pressure chronic ulcer of other part of right lower leg with fat layer exposed: Secondary | ICD-10-CM | POA: Insufficient documentation

## 2024-05-28 DIAGNOSIS — L03115 Cellulitis of right lower limb: Secondary | ICD-10-CM | POA: Insufficient documentation

## 2024-05-28 DIAGNOSIS — L02415 Cutaneous abscess of right lower limb: Secondary | ICD-10-CM | POA: Insufficient documentation

## 2024-05-30 ENCOUNTER — Ambulatory Visit: Admitting: Physical Therapy

## 2024-06-04 ENCOUNTER — Ambulatory Visit: Admitting: Physical Therapy

## 2024-06-04 ENCOUNTER — Encounter: Admitting: Physician Assistant

## 2024-06-04 DIAGNOSIS — L03115 Cellulitis of right lower limb: Secondary | ICD-10-CM | POA: Diagnosis not present

## 2024-06-06 ENCOUNTER — Ambulatory Visit: Attending: Orthopedic Surgery

## 2024-06-06 ENCOUNTER — Ambulatory Visit

## 2024-06-06 DIAGNOSIS — M25511 Pain in right shoulder: Secondary | ICD-10-CM | POA: Insufficient documentation

## 2024-06-06 DIAGNOSIS — M25611 Stiffness of right shoulder, not elsewhere classified: Secondary | ICD-10-CM | POA: Insufficient documentation

## 2024-06-06 DIAGNOSIS — M25512 Pain in left shoulder: Secondary | ICD-10-CM | POA: Insufficient documentation

## 2024-06-06 NOTE — Therapy (Signed)
 " OUTPATIENT PHYSICAL THERAPY CERVICAL EVALUATION   Patient Name: Steve Sampson MRN: [id number] DOB:1950-10-02, 74 y.o., male Today's Date: 06/06/2024  END OF SESSION:  PT End of Session - 06/06/24 1440     Visit Number 1    Number of Visits 7    Date for Recertification  06/29/24    PT Start Time 1440    PT Stop Time 1524    PT Time Calculation (min) 44 min    Activity Tolerance Patient tolerated treatment well    Behavior During Therapy WFL for tasks assessed/performed          Past Medical History:  Diagnosis Date   Arthritis    Bilateral hearing loss    ED (erectile dysfunction)    Gout    Hypertension    Nocturia    Prostate cancer (HCC)    Past Surgical History:  Procedure Laterality Date   CHOLECYSTECTOMY     KNEE SURGERY Right    seed implantation prostate     TONSILECTOMY, ADENOIDECTOMY, BILATERAL MYRINGOTOMY AND TUBES     Patient Active Problem List   Diagnosis Date Noted   Slurred speech 06/16/2021   HTN, goal below 140/90 07/24/2020   Chronic gouty arthritis 07/24/2020   Personal history of prostate cancer 07/24/2020    PCP: Auston Reyes BIRCH, MD   REFERRING PROVIDER: Marchia Drivers, MD  REFERRING DIAG: [phone] (ICD-10-CM) - Other specified joint disorders, left shoulder  THERAPY DIAG:  Right shoulder pain, unspecified chronicity - Plan: PT plan of care cert/re-cert  Left shoulder pain, unspecified chronicity - Plan: PT plan of care cert/re-cert  Stiffness of right shoulder, not elsewhere classified - Plan: PT plan of care cert/re-cert  Rationale for Evaluation and Treatment: Rehabilitation  ONSET DATE: September 2025  SUBJECTIVE:                                                                                                                                                                                                         SUBJECTIVE STATEMENT: R anterior shoulder 5/10 at most for the past 3 months   L anterior shoulder: 3/10 at  most for the past 3 months. No L shoulder pain currently, just feels tight when reaching across.    Hand dominance: Right  PERTINENT HISTORY:  B shoulder pain. L shoulder is improving. R shoulder currently bothers him more. Pt wants to work on his R shoulder. Pain began in the summer for his L shoulder. Pt was lifting a bar up with L and R UE.    Pt thinks falling  in the woods might have caused his R shoulder pain secondary to landing on his R hand and R knee (last week of September 2025)  Had x rays for his R shoulder which did not find anything negative. Has bone spurs in collar bone.  Had x-rays for L shoulder as well.   Has occasional pain in R proximal biceps (around tendon area). Has a hx of R TKA, and going to have L TKA in 3 weeks.   Blood pressure is controlled.  No latex allergies.   PAIN:  Are you having pain? Yes: NPRS scale: 0/10 at rest; 2/10 when reaching across Pain location: R anterior shoulder.  Pain description: sore and achy Aggravating factors: reaching across his body with his R hand and lifting, reaching behind his back, pushing himself up from a chair; reaching across his body with his R hand.  Pronation causes biceps tendon area pain. Reaching up    Relieving factors: massage R upper trap, R pectoralis and R posterior shoulder muscles   PRECAUTIONS: None  RED FLAGS: None     WEIGHT BEARING RESTRICTIONS: No  FALLS:  Has patient fallen in last 6 months? Yes. Number of falls 1, injuring his R knee, pt tripped while hiking in the woods   LIVING ENVIRONMENT: Lives with: lives with their spouse Lives in: House/apartment Stairs: Yes: Internal: 13 steps; on right going up and External: 3 steps; can reach both Has following equipment at home: Grab bars  OCCUPATION: retired  PLOF: Independent Like to build models (such as boats and airplanes)  PATIENT GOALS: be able to reach behind his back more comfortably  NEXT MD VISIT: February 2025  OBJECTIVE:   Note: Objective measures were completed at Evaluation unless otherwise noted.  DIAGNOSTIC FINDINGS:   PATIENT SURVEYS:  UEFS  Extreme difficulty/unable (0), Quite a bit of difficulty (1), Moderate difficulty (2), Little difficulty (3), No difficulty (4) Survey date:   06/06/2024  Any of your usual work, household or school activities 3  2. Your usual hobbies, recreational/sport activities 2   3. Lifting a bag of groceries to waist level 3   4. Lifting a bag of groceries above your head 3  5. Grooming your hair 4  6. Pushing up on your hands (I.e. from bathtub or chair) 2  7. Preparing food (I.e. peeling/cutting) 4  8. Driving  4  9. Vacuuming, sweeping, or raking 4  10. Dressing  2  11. Doing up buttons 3  12. Using tools/appliances Not provided  13. Opening doors 4  14. Cleaning  Not provided  15. Tying or lacing shoes 2  16. Sleeping  3  17. Laundering clothes (I.e. washing, ironing, folding)   18. Opening a jar 4  19. Throwing a ball 2  20. Carrying a small suitcase with your affected limb.  3  Score total:  52/80     COGNITION: Overall cognitive status: Within functional limits for tasks assessed  SENSATION:   POSTURE: B protracted shoulders, R lateral shift  PALPATION: Decreased posterior and inferior R glenohueral joint mobility.    No TTP R biceps tendon and subacromial area.    CERVICAL ROM:   Active ROM A/PROM (deg) eval  Flexion WFL  Extension Limited, gets dizzy, pt states its due to his blood pressure medicine.    Right lateral flexion WFL  Left lateral flexion WFL  Right rotation WFL  Left rotation WFL   (Blank rows = not tested)  UPPER EXTREMITY ROM:  Active ROM  Right eval Left eval  Shoulder flexion 100 with anterior shoulder, 140 degrees AAROM with pain, stiff end feel 134  Shoulder extension    Shoulder abduction 122 with pain (137 AAROM with pain) 157 with tightness reported.   Shoulder adduction    Shoulder extension    Shoulder  internal rotation (functional) R thumb L4 spinous process with with anterior R shoulder pain L thumb to L 3 spinous process  Shoulder external rotation (functional) R 3rd digit at L levator scapula muscle at T1 level ( with R posterior lateral arm pull; around radial nerve area) L 3rd digit to R levator scapula about 1 cm from scapula.   Elbow flexion    Elbow extension    Wrist flexion    Wrist extension    Wrist ulnar deviation    Wrist radial deviation    Wrist pronation    Wrist supination     (Blank rows = not tested)  UPPER EXTREMITY MMT:  MMT Right eval Left eval  Shoulder flexion    Shoulder extension    Shoulder abduction    Shoulder adduction    Shoulder extension    Shoulder internal rotation 5   Shoulder external rotation 4   Middle trapezius 4- with pain   Lower trapezius 4   Elbow flexion 4+ (no pain with isometric, concentric and eccetric resistance   Elbow extension    Wrist flexion    Wrist extension    Wrist ulnar deviation    Wrist radial deviation    Wrist pronation    Wrist supination    Grip strength     (Blank rows = not tested)   SPECIAL TESTS:  (+) Vonzell Berber R shoulder  (-) Empty can test R shoulder  Painful arc observed during R shoulder flexion   FUNCTIONAL TESTS:    TREATMENT DATE: 06/06/2024                                                                                                                                 PATIENT EDUCATION:  Education details: POC Person educated: Patient Education method: Explanation Education comprehension: verbalized understanding  HOME EXERCISE PROGRAM:   ASSESSMENT:  CLINICAL IMPRESSION: Patient is a 74 y.o. male who was seen today for physical therapy evaluation and treatment for R > L shoulder pain. Per pt, L shoulder pain has improved and he would like to work on his R shoulder instead. He also demonstrates altered posture, decreased posterior and inferior R glenohumeral joint  mobility, lower trap and infraspinatus muscle weakness, limited R shoulder AROM all planes with reproduction of anterior shoulder pain, positive special test suggesting anterior shoulder impingement, and difficulty performing tasks which involve reaching across his body as well as reaching behind him secondary to pain. Pt will benefit from skilled physical therapy services to address the aforementioned deficits.    OBJECTIVE IMPAIRMENTS: decreased ROM, decreased strength, impaired UE functional use, improper body mechanics,  postural dysfunction, and pain.   ACTIVITY LIMITATIONS: carrying, lifting, toileting, dressing, reach over head, and hygiene/grooming  PARTICIPATION LIMITATIONS:   PERSONAL FACTORS: Age, Fitness, Time since onset of injury/illness/exacerbation, and 1-2 comorbidities: arthritis, HTN are also affecting patient's functional outcome.   REHAB POTENTIAL: Fair    CLINICAL DECISION MAKING: Stable/uncomplicated  EVALUATION COMPLEXITY: Low   GOALS: Goals reviewed with patient? Yes  SHORT TERM GOALS: Target date: 06/15/2024  Pt will be independent with his initial HEP to decrease pain, improve R shoulder AROM, strength, function, and ability to reach across his body and behind him more comfortably.  Baseline: Pt has not yet started his initial HEP (06/06/2024) Goal status: INITIAL   LONG TERM GOALS: Target date: 06/29/2024  Pt will have a decrease in R anterior shoulder pain to 2/10 or less at L shoulder pain to 0/10 at worst to promote ability to reach across his body and behind him more comfortably.  Baseline: R anterior shoulder 5/10 at most for the past 3 months; L anterior shoulder: 3/10 at most for the past 3 months (06/06/2024) Goal status: INITIAL  2.  Pt will improve R shoulder flexion and abduction AROM by at least 15 degrees to promote ability to reach more comfortably with his R UE.  Baseline:  Active ROM Right eval  Shoulder flexion 100 with anterior shoulder,  140 degrees AAROM with pain, stiff end feel  Shoulder abduction 122 with pain (137 AAROM with pain)   Goal status: INITIAL  3.  Pt will improve his R shoulder functional AROM for IR to thumb to T12 spinous process and ER to R 3rd digit to L scapular spine to promote ability to reach behind him with less difficulty.  Baseline:  Active ROM Right eval  Shoulder internal rotation (functional) R thumb L4 spinous process with with anterior R shoulder pain  Shoulder external rotation (functional) R 3rd digit at L levator scapula muscle at T1 level ( with R posterior lateral arm pull; around radial nerve area)   Goal status: INITIAL  4.  Pt will improve R shoulder ER and lower trap strength by at least 1/2 MMT grade to promote ability to raise his arm up more comfortably for his R shoulder.  Baseline:  MMT Right eval  Shoulder external rotation 4  Middle trapezius 4- with pain  Lower trapezius 4   Goal status: INITIAL  5.  Pt will improve his UEFI score by at least 10 points as a demonstration of improved function.  Baseline: 52/80 (06/06/2024) Goal status: INITIAL   PLAN:  PT FREQUENCY: 1-2x/week  PT DURATION: 3 weeks  PLANNED INTERVENTIONS: 97110-Therapeutic exercises, 97530- Therapeutic activity, 97112- Neuromuscular re-education, 97535- Self Care, 02859- Manual therapy, [id number]- Electrical stimulation (unattended), [phone]- Ionotophoresis 4mg /ml Dexamethasone, 79439 (1-2 muscles), 20561 (3+ muscles)- Dry Needling, Patient/Family education, and Joint mobilization  PLAN FOR NEXT SESSION: posture, thoracic extension, scapular and ER muscle strengthening, R shoulder joint mobility, manual techniques, modalities PRN.    Amiayah Giebel, PT, DPT 06/06/2024, 4:52 PM      "

## 2024-06-07 ENCOUNTER — Ambulatory Visit: Admitting: Physical Therapy

## 2024-06-11 ENCOUNTER — Ambulatory Visit

## 2024-06-11 DIAGNOSIS — M25512 Pain in left shoulder: Secondary | ICD-10-CM

## 2024-06-11 DIAGNOSIS — M25611 Stiffness of right shoulder, not elsewhere classified: Secondary | ICD-10-CM

## 2024-06-11 DIAGNOSIS — M25511 Pain in right shoulder: Secondary | ICD-10-CM

## 2024-06-11 NOTE — Therapy (Signed)
 " OUTPATIENT PHYSICAL THERAPY CERVICAL EVALUATION   Patient Name: Steve Sampson MRN: [id number] DOB:1950-10-21, 74 y.o., male Today's Date: 06/11/2024  END OF SESSION:  PT End of Session - 06/11/24 1521     Visit Number 2    Number of Visits 7    Date for Recertification  06/29/24    PT Start Time 1521    PT Stop Time 1602    PT Time Calculation (min) 41 min    Activity Tolerance Patient tolerated treatment well    Behavior During Therapy [facility] for tasks assessed/performed           Past Medical History:  Diagnosis Date   Arthritis    Bilateral hearing loss    ED (erectile dysfunction)    Gout    Hypertension    Nocturia    Prostate cancer (HCC)    Past Surgical History:  Procedure Laterality Date   CHOLECYSTECTOMY     KNEE SURGERY Right    seed implantation prostate     TONSILECTOMY, ADENOIDECTOMY, BILATERAL MYRINGOTOMY AND TUBES     Patient Active Problem List   Diagnosis Date Noted   Slurred speech 06/16/2021   HTN, goal below 140/90 07/24/2020   Chronic gouty arthritis 07/24/2020   Personal history of prostate cancer 07/24/2020    PCP: Auston Reyes BIRCH, MD   REFERRING PROVIDER: Marchia Drivers, MD  REFERRING DIAG: [phone] (ICD-10-CM) - Other specified joint disorders, left shoulder  THERAPY DIAG:  Right shoulder pain, unspecified chronicity  Left shoulder pain, unspecified chronicity  Stiffness of right shoulder, not elsewhere classified  Rationale for Evaluation and Treatment: Rehabilitation  ONSET DATE: September 2025  SUBJECTIVE:                                                                                                                                                                                                         SUBJECTIVE STATEMENT: R anterior shoulder 5-6/10 currently when raising his R arm up as well as when reaching across currently.   Going to have L TKA on 06/26/2024   Hand dominance: Right  PERTINENT HISTORY:  B  shoulder pain. L shoulder is improving. R shoulder currently bothers him more. Pt wants to work on his R shoulder. Pain began in the summer for his L shoulder. Pt was lifting a bar up with L and R UE.    Pt thinks falling in the woods might have caused his R shoulder pain secondary to landing on his R hand and R knee (last week of September 2025)  Had x rays  for his R shoulder which did not find anything negative. Has bone spurs in collar bone.  Had x-rays for L shoulder as well.   Has occasional pain in R proximal biceps (around tendon area). Has a hx of R TKA, and going to have L TKA in 3 weeks.   Blood pressure is controlled.  No latex allergies.   PAIN:  Are you having pain? Yes: NPRS scale: 0/10 at rest; 2/10 when reaching across Pain location: R anterior shoulder.  Pain description: sore and achy Aggravating factors: reaching across his body with his R hand and lifting, reaching behind his back, pushing himself up from a chair; reaching across his body with his R hand.  Pronation causes biceps tendon area pain. Reaching up    Relieving factors: massage R upper trap, R pectoralis and R posterior shoulder muscles   PRECAUTIONS: None  RED FLAGS: None     WEIGHT BEARING RESTRICTIONS: No  FALLS:  Has patient fallen in last 6 months? Yes. Number of falls 1, injuring his R knee, pt tripped while hiking in the woods   LIVING ENVIRONMENT: Lives with: lives with their spouse Lives in: House/apartment Stairs: Yes: Internal: 13 steps; on right going up and External: 3 steps; can reach both Has following equipment at home: Grab bars  OCCUPATION: retired  PLOF: Independent Like to build models (such as boats and airplanes)  PATIENT GOALS: be able to reach behind his back more comfortably  NEXT MD VISIT: February 2025  OBJECTIVE:  Note: Objective measures were completed at Evaluation unless otherwise noted.  DIAGNOSTIC FINDINGS:   PATIENT SURVEYS:  UEFS  Extreme  difficulty/unable (0), Quite a bit of difficulty (1), Moderate difficulty (2), Little difficulty (3), No difficulty (4) Survey date:   06/06/2024  Any of your usual work, household or school activities 3  2. Your usual hobbies, recreational/sport activities 2   3. Lifting a bag of groceries to waist level 3   4. Lifting a bag of groceries above your head 3  5. Grooming your hair 4  6. Pushing up on your hands (I.e. from bathtub or chair) 2  7. Preparing food (I.e. peeling/cutting) 4  8. Driving  4  9. Vacuuming, sweeping, or raking 4  10. Dressing  2  11. Doing up buttons 3  12. Using tools/appliances Not provided  13. Opening doors 4  14. Cleaning  Not provided  15. Tying or lacing shoes 2  16. Sleeping  3  17. Laundering clothes (I.e. washing, ironing, folding)   18. Opening a jar 4  19. Throwing a ball 2  20. Carrying a small suitcase with your affected limb.  3  Score total:  52/80     COGNITION: Overall cognitive status: Within functional limits for tasks assessed  SENSATION:   POSTURE: B protracted shoulders, R lateral shift  PALPATION: Decreased posterior and inferior R glenohueral joint mobility.    No TTP R biceps tendon and subacromial area.    CERVICAL ROM:   Active ROM A/PROM (deg) eval  Flexion WFL  Extension Limited, gets dizzy, pt states its due to his blood pressure medicine.    Right lateral flexion WFL  Left lateral flexion WFL  Right rotation WFL  Left rotation WFL   (Blank rows = not tested)  UPPER EXTREMITY ROM:  Active ROM Right eval Left eval  Shoulder flexion 100 with anterior shoulder, 140 degrees AAROM with pain, stiff end feel 134  Shoulder extension    Shoulder abduction 122  with pain (137 AAROM with pain) 157 with tightness reported.   Shoulder adduction    Shoulder extension    Shoulder internal rotation (functional) R thumb L4 spinous process with with anterior R shoulder pain L thumb to L 3 spinous process  Shoulder  external rotation (functional) R 3rd digit at L levator scapula muscle at T1 level ( with R posterior lateral arm pull; around radial nerve area) L 3rd digit to R levator scapula about 1 cm from scapula.   Elbow flexion    Elbow extension    Wrist flexion    Wrist extension    Wrist ulnar deviation    Wrist radial deviation    Wrist pronation    Wrist supination     (Blank rows = not tested)  UPPER EXTREMITY MMT:  MMT Right eval Left eval  Shoulder flexion    Shoulder extension    Shoulder abduction    Shoulder adduction    Shoulder extension    Shoulder internal rotation 5   Shoulder external rotation 4   Middle trapezius 4- with pain   Lower trapezius 4   Elbow flexion 4+ (no pain with isometric, concentric and eccetric resistance   Elbow extension    Wrist flexion    Wrist extension    Wrist ulnar deviation    Wrist radial deviation    Wrist pronation    Wrist supination    Grip strength     (Blank rows = not tested)   SPECIAL TESTS:  (+) Vonzell Berber R shoulder  (-) Empty can test R shoulder  Painful arc observed during R shoulder flexion   FUNCTIONAL TESTS:    TREATMENT DATE: 06/11/2024                                                                                                                               Neuromuscular re education Performed with the intent on improving intraarticular mechanics to decrease anterior shoulder pain.    Seated self R shoulder inferior joint glide 10x3 with 5 second holds  Seated triceps extension isometrics, hand on table   R 10x5 seconds for 3 sets  Improved ability to perform shoulder flexion and horizontal abduction more comfortably and improved AROM  Standing B shoulder ER red band 10x3  Burn sensation R biceps area.   Standing R shoulder IR isometrics, hand on abdomen 10x5  seconds  Standing B shoulder extension with scapular retraction green band 10x, then 10x5 seconds   Improved exercise technique,  movement at target joints, use of target muscles after mod verbal, visual, tactile cues.    Manual therapy  Supine with arm in abduction  Posterior and inferior glide to R humeral head   Supine with R shoulder in flexion  STM R teres major muscle to decrease tension  Improved comfort level with R shoulder flexion and horizontal abduction     PATIENT EDUCATION:  Education details: POC Person educated: Patient  Education method: Explanation Education comprehension: verbalized understanding  HOME EXERCISE PROGRAM: Access Code: [email or web address] URL: [email or web address] Date: 06/11/2024 Prepared by: Emil Glassman  Exercises - Seated Shoulder Inferior Glide  - 3 x daily - 7 x weekly - 3 sets - 10 reps - 5 seconds hold - Isometric Tricep Extension   - 1 x daily - 7 x weekly - 3 sets - 10 reps - 5 seconds hold - Standing Shoulder External Rotation with Resistance  - 1 x daily - 7 x weekly - 2 sets - 10 reps      ASSESSMENT:  CLINICAL IMPRESSION: Worked on improving posterior and inferior glide of R humeral head to decrease anterior shoulder joint pressure. Improved R shoulder flexion and horizontal adduction AROM with less pain afterwards. Pt will benefit from continued skilled physical therapy services to decrease pain, improve strength and function.      OBJECTIVE IMPAIRMENTS: decreased ROM, decreased strength, impaired UE functional use, improper body mechanics, postural dysfunction, and pain.   ACTIVITY LIMITATIONS: carrying, lifting, toileting, dressing, reach over head, and hygiene/grooming  PARTICIPATION LIMITATIONS:   PERSONAL FACTORS: Age, Fitness, Time since onset of injury/illness/exacerbation, and 1-2 comorbidities: arthritis, HTN are also affecting patient's functional outcome.   REHAB POTENTIAL: Fair    CLINICAL DECISION MAKING: Stable/uncomplicated  EVALUATION COMPLEXITY: Low   GOALS: Goals reviewed with patient? Yes  SHORT TERM GOALS: Target  date: 06/15/2024  Pt will be independent with his initial HEP to decrease pain, improve R shoulder AROM, strength, function, and ability to reach across his body and behind him more comfortably.  Baseline: Pt has not yet started his initial HEP (06/06/2024) Goal status: INITIAL   LONG TERM GOALS: Target date: 06/29/2024  Pt will have a decrease in R anterior shoulder pain to 2/10 or less at L shoulder pain to 0/10 at worst to promote ability to reach across his body and behind him more comfortably.  Baseline: R anterior shoulder 5/10 at most for the past 3 months; L anterior shoulder: 3/10 at most for the past 3 months (06/06/2024) Goal status: INITIAL  2.  Pt will improve R shoulder flexion and abduction AROM by at least 15 degrees to promote ability to reach more comfortably with his R UE.  Baseline:  Active ROM Right eval  Shoulder flexion 100 with anterior shoulder, 140 degrees AAROM with pain, stiff end feel  Shoulder abduction 122 with pain (137 AAROM with pain)   Goal status: INITIAL  3.  Pt will improve his R shoulder functional AROM for IR to thumb to T12 spinous process and ER to R 3rd digit to L scapular spine to promote ability to reach behind him with less difficulty.  Baseline:  Active ROM Right eval  Shoulder internal rotation (functional) R thumb L4 spinous process with with anterior R shoulder pain  Shoulder external rotation (functional) R 3rd digit at L levator scapula muscle at T1 level ( with R posterior lateral arm pull; around radial nerve area)   Goal status: INITIAL  4.  Pt will improve R shoulder ER and lower trap strength by at least 1/2 MMT grade to promote ability to raise his arm up more comfortably for his R shoulder.  Baseline:  MMT Right eval  Shoulder external rotation 4  Middle trapezius 4- with pain  Lower trapezius 4   Goal status: INITIAL  5.  Pt will improve his UEFI score by at least 10 points as a demonstration of improved function.   Baseline: 52/80 (06/06/2024) Goal  status: INITIAL   PLAN:  PT FREQUENCY: 1-2x/week  PT DURATION: 3 weeks  PLANNED INTERVENTIONS: 97110-Therapeutic exercises, 97530- Therapeutic activity, [id number]- Neuromuscular re-education, 97535- Self Care, [id number]- Manual therapy, [id number]- Electrical stimulation (unattended), [phone]- Ionotophoresis 4mg /ml Dexamethasone, 79439 (1-2 muscles), 20561 (3+ muscles)- Dry Needling, Patient/Family education, and Joint mobilization  PLAN FOR NEXT SESSION: posture, thoracic extension, scapular and ER muscle strengthening, R shoulder joint mobility, manual techniques, modalities PRN.    Silvio Sausedo, PT, DPT 06/11/2024, 4:21 PM      "

## 2024-06-13 ENCOUNTER — Ambulatory Visit

## 2024-06-13 DIAGNOSIS — M25512 Pain in left shoulder: Secondary | ICD-10-CM

## 2024-06-13 DIAGNOSIS — M25511 Pain in right shoulder: Secondary | ICD-10-CM

## 2024-06-13 DIAGNOSIS — M25611 Stiffness of right shoulder, not elsewhere classified: Secondary | ICD-10-CM

## 2024-06-13 NOTE — Therapy (Signed)
 " OUTPATIENT PHYSICAL THERAPY CERVICAL EVALUATION   Patient Name: Steve Sampson MRN: [id number] DOB:08-29-1950, 74 y.o., male Today's Date: 06/13/2024  END OF SESSION:  PT End of Session - 06/13/24 0950     Visit Number 3    Number of Visits 7    Date for Recertification  06/29/24    PT Start Time 0950    PT Stop Time 1032    PT Time Calculation (min) 42 min    Activity Tolerance Patient tolerated treatment well    Behavior During Therapy [facility] for tasks assessed/performed            Past Medical History:  Diagnosis Date   Arthritis    Bilateral hearing loss    ED (erectile dysfunction)    Gout    Hypertension    Nocturia    Prostate cancer (HCC)    Past Surgical History:  Procedure Laterality Date   CHOLECYSTECTOMY     KNEE SURGERY Right    seed implantation prostate     TONSILECTOMY, ADENOIDECTOMY, BILATERAL MYRINGOTOMY AND TUBES     Patient Active Problem List   Diagnosis Date Noted   Slurred speech 06/16/2021   HTN, goal below 140/90 07/24/2020   Chronic gouty arthritis 07/24/2020   Personal history of prostate cancer 07/24/2020    PCP: Auston Reyes BIRCH, MD   REFERRING PROVIDER: Marchia Drivers, MD  REFERRING DIAG: [id number] (ICD-10-CM) - Other specified joint disorders, left shoulder  THERAPY DIAG:  Right shoulder pain, unspecified chronicity  Left shoulder pain, unspecified chronicity  Stiffness of right shoulder, not elsewhere classified  Rationale for Evaluation and Treatment: Rehabilitation  ONSET DATE: September 2025  SUBJECTIVE:                                                                                                                                                                                                         SUBJECTIVE STATEMENT: R shoulder is a little sore, other than that its better. Better able to reach across and behind him. Pushing himself up is still difficult for his R shoulder. 2/10 when raising his R arm up.      Going to have L TKA on 06/26/2024   Hand dominance: Right  PERTINENT HISTORY:  B shoulder pain. L shoulder is improving. R shoulder currently bothers him more. Pt wants to work on his R shoulder. Pain began in the summer for his L shoulder. Pt was lifting a bar up with L and R UE.    Pt thinks falling in the woods might have caused his R  shoulder pain secondary to landing on his R hand and R knee (last week of September 2025)  Had x rays for his R shoulder which did not find anything negative. Has bone spurs in collar bone.  Had x-rays for L shoulder as well.   Has occasional pain in R proximal biceps (around tendon area). Has a hx of R TKA, and going to have L TKA in 3 weeks.   Blood pressure is controlled.  No latex allergies.   PAIN:  Are you having pain? Yes: NPRS scale: 0/10 at rest; 2/10 when reaching across Pain location: R anterior shoulder.  Pain description: sore and achy Aggravating factors: reaching across his body with his R hand and lifting, reaching behind his back, pushing himself up from a chair; reaching across his body with his R hand.  Pronation causes biceps tendon area pain. Reaching up    Relieving factors: massage R upper trap, R pectoralis and R posterior shoulder muscles   PRECAUTIONS: None  RED FLAGS: None     WEIGHT BEARING RESTRICTIONS: No  FALLS:  Has patient fallen in last 6 months? Yes. Number of falls 1, injuring his R knee, pt tripped while hiking in the woods   LIVING ENVIRONMENT: Lives with: lives with their spouse Lives in: House/apartment Stairs: Yes: Internal: 13 steps; on right going up and External: 3 steps; can reach both Has following equipment at home: Grab bars  OCCUPATION: retired  PLOF: Independent Like to build models (such as boats and airplanes)  PATIENT GOALS: be able to reach behind his back more comfortably  NEXT MD VISIT: February 2025  OBJECTIVE:  Note: Objective measures were completed at Evaluation  unless otherwise noted.  DIAGNOSTIC FINDINGS:   PATIENT SURVEYS:  UEFS  Extreme difficulty/unable (0), Quite a bit of difficulty (1), Moderate difficulty (2), Little difficulty (3), No difficulty (4) Survey date:   06/06/2024  Any of your usual work, household or school activities 3  2. Your usual hobbies, recreational/sport activities 2   3. Lifting a bag of groceries to waist level 3   4. Lifting a bag of groceries above your head 3  5. Grooming your hair 4  6. Pushing up on your hands (I.e. from bathtub or chair) 2  7. Preparing food (I.e. peeling/cutting) 4  8. Driving  4  9. Vacuuming, sweeping, or raking 4  10. Dressing  2  11. Doing up buttons 3  12. Using tools/appliances Not provided  13. Opening doors 4  14. Cleaning  Not provided  15. Tying or lacing shoes 2  16. Sleeping  3  17. Laundering clothes (I.e. washing, ironing, folding)   18. Opening a jar 4  19. Throwing a ball 2  20. Carrying a small suitcase with your affected limb.  3  Score total:  52/80     COGNITION: Overall cognitive status: Within functional limits for tasks assessed  SENSATION:   POSTURE: B protracted shoulders, R lateral shift  PALPATION: Decreased posterior and inferior R glenohueral joint mobility.    No TTP R biceps tendon and subacromial area.    CERVICAL ROM:   Active ROM A/PROM (deg) eval  Flexion WFL  Extension Limited, gets dizzy, pt states its due to his blood pressure medicine.    Right lateral flexion WFL  Left lateral flexion WFL  Right rotation WFL  Left rotation WFL   (Blank rows = not tested)  UPPER EXTREMITY ROM:  Active ROM Right eval Left eval  Shoulder flexion 100  with anterior shoulder, 140 degrees AAROM with pain, stiff end feel 134  Shoulder extension    Shoulder abduction 122 with pain (137 AAROM with pain) 157 with tightness reported.   Shoulder adduction    Shoulder extension    Shoulder internal rotation (functional) R thumb L4 spinous  process with with anterior R shoulder pain L thumb to L 3 spinous process  Shoulder external rotation (functional) R 3rd digit at L levator scapula muscle at T1 level ( with R posterior lateral arm pull; around radial nerve area) L 3rd digit to R levator scapula about 1 cm from scapula.   Elbow flexion    Elbow extension    Wrist flexion    Wrist extension    Wrist ulnar deviation    Wrist radial deviation    Wrist pronation    Wrist supination     (Blank rows = not tested)  UPPER EXTREMITY MMT:  MMT Right eval Left eval  Shoulder flexion    Shoulder extension    Shoulder abduction    Shoulder adduction    Shoulder extension    Shoulder internal rotation 5   Shoulder external rotation 4   Middle trapezius 4- with pain   Lower trapezius 4   Elbow flexion 4+ (no pain with isometric, concentric and eccetric resistance   Elbow extension    Wrist flexion    Wrist extension    Wrist ulnar deviation    Wrist radial deviation    Wrist pronation    Wrist supination    Grip strength     (Blank rows = not tested)   SPECIAL TESTS:  (+) Vonzell Berber R shoulder  (-) Empty can test R shoulder  Painful arc observed during R shoulder flexion   FUNCTIONAL TESTS:    TREATMENT DATE: 06/13/2024                                                                                                                               Neuromuscular re education Performed with the intent on improving intraarticular mechanics to decrease anterior shoulder pain.   Supine with R shoulder in 90 degrees flexion  Triceps extension blue band 10x5 seconds for 3 sets  Supine PNF D2 flexion red band R 10x3 with PT assist initially for proper movement.   Doorway pectoralis stretch  R 30 seconds x 3  Standing B shoulder extension with scapular retraction green band 10x5 seconds for 3 sets   Improved exercise technique, movement at target joints, use of target muscles after mod verbal, visual, tactile  cues.    Manual therapy Supine with arm in abduction  Posterior and inferior, and posteriorinferior glide to R humeral head grade 3 to 3+ to promote R shoulder joint mobility  Supine with R shoulder in flexion  STM R teres major and posterior deltoid muscle to decrease tension     PATIENT EDUCATION:  Education details: POC Person educated: Patient Education method:  Explanation Education comprehension: verbalized understanding  HOME EXERCISE PROGRAM: Access Code: [email or web address] URL: [email or web address] Date: 06/11/2024 Prepared by: Emil Glassman  Exercises - Seated Shoulder Inferior Glide  - 3 x daily - 7 x weekly - 3 sets - 10 reps - 5 seconds hold - Isometric Tricep Extension   - 1 x daily - 7 x weekly - 3 sets - 10 reps - 5 seconds hold - Standing Shoulder External Rotation with Resistance  - 1 x daily - 7 x weekly - 2 sets - 10 reps  - Single Arm Doorway Pec Stretch at 60 Elevation  - 3 x daily - 7 x weekly - 1 sets - 5 reps - 30 seconds hold - Shoulder extension with resistance - Neutral  - 1 x daily - 7 x weekly - 3 sets - 10 reps - 5 seconds hold    ASSESSMENT:  CLINICAL IMPRESSION: Improved ability to reach based on subjective reports. Continued working on improving posterior and inferior glide of R humeral head to decrease anterior shoulder joint pressure. Pt tolerated session well without aggravation of symptoms. Pt will benefit from continued skilled physical therapy services to decrease pain, improve strength and function.      OBJECTIVE IMPAIRMENTS: decreased ROM, decreased strength, impaired UE functional use, improper body mechanics, postural dysfunction, and pain.   ACTIVITY LIMITATIONS: carrying, lifting, toileting, dressing, reach over head, and hygiene/grooming  PARTICIPATION LIMITATIONS:   PERSONAL FACTORS: Age, Fitness, Time since onset of injury/illness/exacerbation, and 1-2 comorbidities: arthritis, HTN are also affecting patient's  functional outcome.   REHAB POTENTIAL: Fair    CLINICAL DECISION MAKING: Stable/uncomplicated  EVALUATION COMPLEXITY: Low   GOALS: Goals reviewed with patient? Yes  SHORT TERM GOALS: Target date: 06/15/2024  Pt will be independent with his initial HEP to decrease pain, improve R shoulder AROM, strength, function, and ability to reach across his body and behind him more comfortably.  Baseline: Pt has not yet started his initial HEP (06/06/2024) Goal status: INITIAL   LONG TERM GOALS: Target date: 06/29/2024  Pt will have a decrease in R anterior shoulder pain to 2/10 or less at L shoulder pain to 0/10 at worst to promote ability to reach across his body and behind him more comfortably.  Baseline: R anterior shoulder 5/10 at most for the past 3 months; L anterior shoulder: 3/10 at most for the past 3 months (06/06/2024) Goal status: INITIAL  2.  Pt will improve R shoulder flexion and abduction AROM by at least 15 degrees to promote ability to reach more comfortably with his R UE.  Baseline:  Active ROM Right eval  Shoulder flexion 100 with anterior shoulder, 140 degrees AAROM with pain, stiff end feel  Shoulder abduction 122 with pain (137 AAROM with pain)   Goal status: INITIAL  3.  Pt will improve his R shoulder functional AROM for IR to thumb to T12 spinous process and ER to R 3rd digit to L scapular spine to promote ability to reach behind him with less difficulty.  Baseline:  Active ROM Right eval  Shoulder internal rotation (functional) R thumb L4 spinous process with with anterior R shoulder pain  Shoulder external rotation (functional) R 3rd digit at L levator scapula muscle at T1 level ( with R posterior lateral arm pull; around radial nerve area)   Goal status: INITIAL  4.  Pt will improve R shoulder ER and lower trap strength by at least 1/2 MMT grade to promote ability to raise his arm up more comfortably  for his R shoulder.  Baseline:  MMT Right eval  Shoulder  external rotation 4  Middle trapezius 4- with pain  Lower trapezius 4   Goal status: INITIAL  5.  Pt will improve his UEFI score by at least 10 points as a demonstration of improved function.  Baseline: 52/80 (06/06/2024) Goal status: INITIAL   PLAN:  PT FREQUENCY: 1-2x/week  PT DURATION: 3 weeks  PLANNED INTERVENTIONS: 97110-Therapeutic exercises, 97530- Therapeutic activity, 97112- Neuromuscular re-education, 97535- Self Care, [id number]- Manual therapy, [id number]- Electrical stimulation (unattended), [phone]- Ionotophoresis 4mg /ml Dexamethasone, 79439 (1-2 muscles), 20561 (3+ muscles)- Dry Needling, Patient/Family education, and Joint mobilization  PLAN FOR NEXT SESSION: posture, thoracic extension, scapular and ER muscle strengthening, R shoulder joint mobility, manual techniques, modalities PRN.    Carly Applegate, PT, DPT 06/13/2024, 4:11 PM      "

## 2024-06-18 ENCOUNTER — Ambulatory Visit

## 2024-06-21 ENCOUNTER — Ambulatory Visit

## 2024-06-21 DIAGNOSIS — M25611 Stiffness of right shoulder, not elsewhere classified: Secondary | ICD-10-CM

## 2024-06-21 DIAGNOSIS — M25511 Pain in right shoulder: Secondary | ICD-10-CM

## 2024-06-21 NOTE — Therapy (Signed)
 " OUTPATIENT PHYSICAL THERAPY TREATMENT And  Discharge Summary (for shoulder)  Patient Name: Steve Sampson MRN: [id number] DOB:February 01, 1951, 74 y.o., male Today's Date: 06/21/2024  END OF SESSION:  PT End of Session - 06/21/24 0951     Visit Number 4    Number of Visits 7    Date for Recertification  06/29/24    PT Start Time 0951    PT Stop Time 1035    PT Time Calculation (min) 44 min    Activity Tolerance Patient tolerated treatment well    Behavior During Therapy [facility] for tasks assessed/performed             Past Medical History:  Diagnosis Date   Arthritis    Bilateral hearing loss    ED (erectile dysfunction)    Gout    Hypertension    Nocturia    Prostate cancer (HCC)    Past Surgical History:  Procedure Laterality Date   CHOLECYSTECTOMY     KNEE SURGERY Right    seed implantation prostate     TONSILECTOMY, ADENOIDECTOMY, BILATERAL MYRINGOTOMY AND TUBES     Patient Active Problem List   Diagnosis Date Noted   Slurred speech 06/16/2021   HTN, goal below 140/90 07/24/2020   Chronic gouty arthritis 07/24/2020   Personal history of prostate cancer 07/24/2020    PCP: Auston Reyes BIRCH, MD   REFERRING PROVIDER: Marchia Drivers, MD  REFERRING DIAG: [phone] (ICD-10-CM) - Other specified joint disorders, left shoulder  THERAPY DIAG:  Right shoulder pain, unspecified chronicity  Stiffness of right shoulder, not elsewhere classified  Rationale for Evaluation and Treatment: Rehabilitation  ONSET DATE: September 2025  SUBJECTIVE:                                                                                                                                                                                                         SUBJECTIVE STATEMENT: R shoulder is better, getting more range reaching across. Still has issues pushing himself up with his hands.    Going to have L TKA on 06/26/2024   Hand dominance: Right  PERTINENT HISTORY:  B shoulder  pain. L shoulder is improving. R shoulder currently bothers him more. Pt wants to work on his R shoulder. Pain began in the summer for his L shoulder. Pt was lifting a bar up with L and R UE.    Pt thinks falling in the woods might have caused his R shoulder pain secondary to landing on his R hand and R knee (last week of September 2025)  Had x  rays for his R shoulder which did not find anything negative. Has bone spurs in collar bone.  Had x-rays for L shoulder as well.   Has occasional pain in R proximal biceps (around tendon area). Has a hx of R TKA, and going to have L TKA in 3 weeks.   Blood pressure is controlled.  No latex allergies.   PAIN:  Are you having pain? Yes: NPRS scale: 0/10 at rest; 2/10 when reaching across Pain location: R anterior shoulder.  Pain description: sore and achy Aggravating factors: reaching across his body with his R hand and lifting, reaching behind his back, pushing himself up from a chair; reaching across his body with his R hand.  Pronation causes biceps tendon area pain. Reaching up    Relieving factors: massage R upper trap, R pectoralis and R posterior shoulder muscles   PRECAUTIONS: None  RED FLAGS: None     WEIGHT BEARING RESTRICTIONS: No  FALLS:  Has patient fallen in last 6 months? Yes. Number of falls 1, injuring his R knee, pt tripped while hiking in the woods   LIVING ENVIRONMENT: Lives with: lives with their spouse Lives in: House/apartment Stairs: Yes: Internal: 13 steps; on right going up and External: 3 steps; can reach both Has following equipment at home: Grab bars  OCCUPATION: retired  PLOF: Independent Like to build models (such as boats and airplanes)  PATIENT GOALS: be able to reach behind his back more comfortably  NEXT MD VISIT: February 2025  OBJECTIVE:  Note: Objective measures were completed at Evaluation unless otherwise noted.  DIAGNOSTIC FINDINGS:   PATIENT SURVEYS:  UEFS  Extreme difficulty/unable  (0), Quite a bit of difficulty (1), Moderate difficulty (2), Little difficulty (3), No difficulty (4) Survey date:   06/06/2024  Any of your usual work, household or school activities 3  2. Your usual hobbies, recreational/sport activities 2   3. Lifting a bag of groceries to waist level 3   4. Lifting a bag of groceries above your head 3  5. Grooming your hair 4  6. Pushing up on your hands (I.e. from bathtub or chair) 2  7. Preparing food (I.e. peeling/cutting) 4  8. Driving  4  9. Vacuuming, sweeping, or raking 4  10. Dressing  2  11. Doing up buttons 3  12. Using tools/appliances Not provided  13. Opening doors 4  14. Cleaning  Not provided  15. Tying or lacing shoes 2  16. Sleeping  3  17. Laundering clothes (I.e. washing, ironing, folding)   18. Opening a jar 4  19. Throwing a ball 2  20. Carrying a small suitcase with your affected limb.  3  Score total:  52/80     Extreme difficulty/unable (0), Quite a bit of difficulty (1), Moderate difficulty (2), Little difficulty (3), No difficulty (4) Survey date:  06/21/2024  Any of your usual work, household or school activities 4  2. Your usual hobbies, recreational/sport activities 4   3. Lifting a bag of groceries to waist level 3   4. Lifting a bag of groceries above your head Not answered  5. Grooming your hair 2  6. Pushing up on your hands (I.e. from bathtub or chair) 3  7. Preparing food (I.e. peeling/cutting) 4  8. Driving  4  9. Vacuuming, sweeping, or raking 4  10. Dressing  3  11. Doing up buttons 4  12. Using tools/appliances Not answered  13. Opening doors 4  14. Cleaning  4  15.  Tying or lacing shoes 4  16. Sleeping  3  17. Laundering clothes (I.e. washing, ironing, folding) 4  18. Opening a jar 3  19. Throwing a ball 2  20. Carrying a small suitcase with your affected limb.  4  Score total:  63/80     COGNITION: Overall cognitive status: Within functional limits for tasks  assessed  SENSATION:   POSTURE: B protracted shoulders, R lateral shift  PALPATION: Decreased posterior and inferior R glenohueral joint mobility.    No TTP R biceps tendon and subacromial area.    CERVICAL ROM:   Active ROM A/PROM (deg) eval  Flexion WFL  Extension Limited, gets dizzy, pt states its due to his blood pressure medicine.    Right lateral flexion WFL  Left lateral flexion WFL  Right rotation WFL  Left rotation WFL   (Blank rows = not tested)  UPPER EXTREMITY ROM:  Active ROM Right eval Left eval  Shoulder flexion 100 with anterior shoulder, 140 degrees AAROM with pain, stiff end feel 134  Shoulder extension    Shoulder abduction 122 with pain (137 AAROM with pain) 157 with tightness reported.   Shoulder adduction    Shoulder extension    Shoulder internal rotation (functional) R thumb L4 spinous process with with anterior R shoulder pain L thumb to L 3 spinous process  Shoulder external rotation (functional) R 3rd digit at L levator scapula muscle at T1 level ( with R posterior lateral arm pull; around radial nerve area) L 3rd digit to R levator scapula about 1 cm from scapula.   Elbow flexion    Elbow extension    Wrist flexion    Wrist extension    Wrist ulnar deviation    Wrist radial deviation    Wrist pronation    Wrist supination     (Blank rows = not tested)  UPPER EXTREMITY MMT:  MMT Right eval Left eval  Shoulder flexion    Shoulder extension    Shoulder abduction    Shoulder adduction    Shoulder extension    Shoulder internal rotation 5   Shoulder external rotation 4   Middle trapezius 4- with pain   Lower trapezius 4   Elbow flexion 4+ (no pain with isometric, concentric and eccetric resistance   Elbow extension    Wrist flexion    Wrist extension    Wrist ulnar deviation    Wrist radial deviation    Wrist pronation    Wrist supination    Grip strength     (Blank rows = not tested)   SPECIAL TESTS:  (+) Vonzell Berber R shoulder  (-) Empty can test R shoulder  Painful arc observed during R shoulder flexion   FUNCTIONAL TESTS:    TREATMENT DATE: 06/21/2024  Therapeutic activity Performed with the intent of improving ability to use R UE for functional tasks.    Sit <> stand with proper weight shifting and B scapular retraction   Decreased R shoulder discomfort   Standing triceps extension B at [facility] machine   15 lbs for 10x, then 10x5 seconds for 3 sets  Seated B scapular retraction at [employer] machine plate 20 for 89k4 seconds  Then plate 25 for 89k4 seconds for 2 sets  Standing B shoulder ER green band 10x5 seconds for 3 sets  Doorway pectoralis stretch  R 1 minute x 1  R shoulder flexion, abduction, functional IR and ER AROM.   Standing manually resisted R shoulder ER   Reviewed progress/current status with PT     Standing B shoulder extension with scapular retraction green band 10x5 seconds for 3 sets   Improved exercise technique, movement at target joints, use of target muscles after mod verbal, visual, tactile cues.    Manual therapy Supine with arm in abduction  Posterior and inferior, and posteriorinferior glide to R humeral head grade 3 to 3+ to promote R shoulder joint mobility  Supine with R shoulder in abduction  STM R pectoralis muscle to decrease tension     PATIENT EDUCATION:  Education details: POC Person educated: Patient Education method: Explanation Education comprehension: verbalized understanding  HOME EXERCISE PROGRAM: Access Code: [email or web address] URL: [email or web address] Date: 06/11/2024 Prepared by: Emil Glassman  Exercises - Seated Shoulder Inferior Glide  - 3 x daily - 7 x weekly - 3 sets - 10 reps - 5 seconds hold - Isometric Tricep Extension   - 1 x daily - 7 x weekly - 3 sets - 10 reps - 5 seconds  hold - Standing Shoulder External Rotation with Resistance  - 1 x daily - 7 x weekly - 2 sets - 10 reps  Red  Upgraded to Green (06/21/2024)  - Single Arm Doorway Pec Stretch at 60 Elevation  - 3 x daily - 7 x weekly - 1 sets - 5 reps - 30 seconds hold - Shoulder extension with resistance - Neutral  - 1 x daily - 7 x weekly - 3 sets - 10 reps - 5 seconds hold    ASSESSMENT:  CLINICAL IMPRESSION: Pt demonstrates decreased R shoulder pain, improved R shoulder flexion and abduction AROM and function since initial evaluation. Pt has made progress with PT towards goals. Skilled physical therapy services discharged for shoulder with pt continuing progress with his exercises at home.      OBJECTIVE IMPAIRMENTS: decreased ROM, decreased strength, impaired UE functional use, improper body mechanics, postural dysfunction, and pain.   ACTIVITY LIMITATIONS: carrying, lifting, toileting, dressing, reach over head, and hygiene/grooming  PARTICIPATION LIMITATIONS:   PERSONAL FACTORS: Age, Fitness, Time since onset of injury/illness/exacerbation, and 1-2 comorbidities: arthritis, HTN are also affecting patient's functional outcome.   REHAB POTENTIAL: Fair    CLINICAL DECISION MAKING: Stable/uncomplicated  EVALUATION COMPLEXITY: Low   GOALS: Goals reviewed with patient? Yes  SHORT TERM GOALS: Target date: 06/15/2024  Pt will be independent with his initial HEP to decrease pain, improve R shoulder AROM, strength, function, and ability to reach across his body and behind him more comfortably.  Baseline: Pt has not yet started his initial HEP (06/06/2024); No questions (06/21/2024) Goal status: MET   LONG TERM GOALS: Target date: 06/29/2024  Pt will have a decrease in R anterior shoulder pain to 2/10 or less at L shoulder pain to 0/10 at worst to promote  ability to reach across his body and behind him more comfortably.  Baseline: R anterior shoulder 5/10 at most for the past 3 months; L anterior  shoulder: 3/10 at most for the past 7 days (addendum 06/21/2024)(06/06/2024); 2/10 R shoulder pain at most for the past 7 days (06/21/2024) Goal status: MET  2.  Pt will improve R shoulder flexion and abduction AROM by at least 15 degrees to promote ability to reach more comfortably with his R UE.  Baseline:  Active ROM Right eval R 06/21/2024  Shoulder flexion 100 with anterior shoulder, 140 degrees AAROM with pain, stiff end feel 145 degrees   Shoulder abduction 122 with pain (137 AAROM with pain) 155 degrees   Goal status: MET   3.  Pt will improve his R shoulder functional AROM for IR to thumb to T12 spinous process and ER to R 3rd digit to L scapular spine to promote ability to reach behind him with less difficulty.  Baseline:  Active ROM Right eval R 06/21/2024  Shoulder internal rotation (functional) R thumb L4 spinous process with with anterior R shoulder pain R thumb L4 spinous process with with anterior R shoulder pain  Shoulder external rotation (functional) R 3rd digit at L levator scapula muscle at T1 level ( with R posterior lateral arm pull; around radial nerve area) R 3rd digit at L levator scapula muscle at T1 level ( with R posterior lateral arm pull; around radial nerve area)   Goal status: ONGOING  4.  Pt will improve R shoulder ER and lower trap strength by at least 1/2 MMT grade to promote ability to raise his arm up more comfortably for his R shoulder.  Baseline:  MMT Right eval R 06/21/2024  Shoulder external rotation 4 4+  Middle trapezius 4- with pain   Lower trapezius 4    Goal status: Partially met  5.  Pt will improve his UEFI score by at least 10 points as a demonstration of improved function.  Baseline: 52/80 (06/06/2024); 63/80 (06/21/2024) Goal status: MET   PLAN:  PT FREQUENCY: 1-2x/week  PT DURATION: 3 weeks  PLANNED INTERVENTIONS: 97110-Therapeutic exercises, 97530- Therapeutic activity, 97112- Neuromuscular re-education, 97535- Self Care,  97140- Manual therapy, G0283- Electrical stimulation (unattended), [id number]- Ionotophoresis 4mg /ml Dexamethasone, 79439 (1-2 muscles), 20561 (3+ muscles)- Dry Needling, Patient/Family education, and Joint mobilization  PLAN FOR NEXT SESSION: posture, thoracic extension, scapular and ER muscle strengthening, R shoulder joint mobility, manual techniques, modalities PRN.    Elonzo Sopp, PT, DPT 06/21/2024, 12:34 PM      "

## 2024-06-25 ENCOUNTER — Ambulatory Visit

## 2024-06-27 ENCOUNTER — Ambulatory Visit

## 2024-07-02 ENCOUNTER — Ambulatory Visit

## 2024-07-04 ENCOUNTER — Ambulatory Visit

## 2024-07-09 ENCOUNTER — Ambulatory Visit

## 2024-07-11 ENCOUNTER — Ambulatory Visit

## 2024-07-16 ENCOUNTER — Ambulatory Visit

## 2024-07-18 ENCOUNTER — Ambulatory Visit

## 2024-07-23 ENCOUNTER — Ambulatory Visit

## 2024-07-25 ENCOUNTER — Ambulatory Visit

## 2024-08-15 ENCOUNTER — Ambulatory Visit

## 2024-08-20 ENCOUNTER — Ambulatory Visit
